# Patient Record
Sex: Male | Born: 1960 | Race: White | Hispanic: No | Marital: Married | Smoking: Current every day smoker
Health system: Southern US, Community
[De-identification: ages and names within clinical notes are randomized; demographics above are authoritative.]

## PROBLEM LIST (undated history)

## (undated) DIAGNOSIS — K2270 Barrett's esophagus without dysplasia: Secondary | ICD-10-CM

## (undated) DIAGNOSIS — G935 Compression of brain: Secondary | ICD-10-CM

## (undated) DIAGNOSIS — I2510 Atherosclerotic heart disease of native coronary artery without angina pectoris: Secondary | ICD-10-CM

## (undated) DIAGNOSIS — E785 Hyperlipidemia, unspecified: Secondary | ICD-10-CM

## (undated) DIAGNOSIS — F319 Bipolar disorder, unspecified: Secondary | ICD-10-CM

## (undated) DIAGNOSIS — I219 Acute myocardial infarction, unspecified: Secondary | ICD-10-CM

## (undated) DIAGNOSIS — I440 Atrioventricular block, first degree: Secondary | ICD-10-CM

## (undated) DIAGNOSIS — J45909 Unspecified asthma, uncomplicated: Secondary | ICD-10-CM

## (undated) DIAGNOSIS — R42 Dizziness and giddiness: Secondary | ICD-10-CM

## (undated) DIAGNOSIS — G43909 Migraine, unspecified, not intractable, without status migrainosus: Secondary | ICD-10-CM

## (undated) DIAGNOSIS — F4310 Post-traumatic stress disorder, unspecified: Secondary | ICD-10-CM

## (undated) DIAGNOSIS — Z8679 Personal history of other diseases of the circulatory system: Secondary | ICD-10-CM

## (undated) DIAGNOSIS — K219 Gastro-esophageal reflux disease without esophagitis: Secondary | ICD-10-CM

## (undated) HISTORY — DX: Post-traumatic stress disorder, unspecified: F43.10

## (undated) HISTORY — DX: Atrioventricular block, first degree: I44.0

## (undated) HISTORY — PX: HEMORRHOID SURGERY: SHX153

## (undated) HISTORY — DX: Acute myocardial infarction, unspecified: I21.9

## (undated) HISTORY — DX: Personal history of other diseases of the circulatory system: Z86.79

## (undated) HISTORY — DX: Bipolar disorder, unspecified: F31.9

## (undated) HISTORY — DX: Barrett's esophagus without dysplasia: K22.70

## (undated) HISTORY — DX: Hyperlipidemia, unspecified: E78.5

## (undated) HISTORY — DX: Dizziness and giddiness: R42

## (undated) HISTORY — DX: Gastro-esophageal reflux disease without esophagitis: K21.9

## (undated) HISTORY — DX: Migraine, unspecified, not intractable, without status migrainosus: G43.909

## (undated) HISTORY — PX: EYE SURGERY: SHX253

## (undated) HISTORY — DX: Unspecified asthma, uncomplicated: J45.909

## (undated) HISTORY — DX: Atherosclerotic heart disease of native coronary artery without angina pectoris: I25.10

## (undated) HISTORY — DX: Compression of brain: G93.5

---

## 2004-08-25 ENCOUNTER — Ambulatory Visit: Payer: Self-pay | Admitting: Oncology

## 2004-12-22 ENCOUNTER — Ambulatory Visit: Payer: Self-pay | Admitting: Oncology

## 2015-01-09 DIAGNOSIS — M5126 Other intervertebral disc displacement, lumbar region: Secondary | ICD-10-CM | POA: Diagnosis not present

## 2015-01-16 DIAGNOSIS — M549 Dorsalgia, unspecified: Secondary | ICD-10-CM | POA: Diagnosis not present

## 2015-01-16 DIAGNOSIS — G8929 Other chronic pain: Secondary | ICD-10-CM | POA: Diagnosis not present

## 2015-01-16 DIAGNOSIS — M5136 Other intervertebral disc degeneration, lumbar region: Secondary | ICD-10-CM | POA: Diagnosis not present

## 2015-01-16 DIAGNOSIS — M47896 Other spondylosis, lumbar region: Secondary | ICD-10-CM | POA: Diagnosis not present

## 2015-01-17 DIAGNOSIS — K589 Irritable bowel syndrome without diarrhea: Secondary | ICD-10-CM | POA: Diagnosis not present

## 2015-01-17 DIAGNOSIS — K219 Gastro-esophageal reflux disease without esophagitis: Secondary | ICD-10-CM | POA: Diagnosis not present

## 2015-01-30 DIAGNOSIS — R635 Abnormal weight gain: Secondary | ICD-10-CM | POA: Diagnosis not present

## 2015-01-30 DIAGNOSIS — Z6830 Body mass index (BMI) 30.0-30.9, adult: Secondary | ICD-10-CM | POA: Diagnosis not present

## 2015-02-13 DIAGNOSIS — B079 Viral wart, unspecified: Secondary | ICD-10-CM | POA: Diagnosis not present

## 2015-02-13 DIAGNOSIS — L299 Pruritus, unspecified: Secondary | ICD-10-CM | POA: Diagnosis not present

## 2015-02-19 DIAGNOSIS — K219 Gastro-esophageal reflux disease without esophagitis: Secondary | ICD-10-CM | POA: Diagnosis not present

## 2015-02-19 DIAGNOSIS — K589 Irritable bowel syndrome without diarrhea: Secondary | ICD-10-CM | POA: Diagnosis not present

## 2015-02-21 DIAGNOSIS — I2510 Atherosclerotic heart disease of native coronary artery without angina pectoris: Secondary | ICD-10-CM | POA: Insufficient documentation

## 2015-02-21 DIAGNOSIS — E785 Hyperlipidemia, unspecified: Secondary | ICD-10-CM | POA: Insufficient documentation

## 2015-02-21 DIAGNOSIS — F17200 Nicotine dependence, unspecified, uncomplicated: Secondary | ICD-10-CM | POA: Insufficient documentation

## 2015-02-21 DIAGNOSIS — IMO0001 Reserved for inherently not codable concepts without codable children: Secondary | ICD-10-CM

## 2015-02-21 HISTORY — DX: Nicotine dependence, unspecified, uncomplicated: F17.200

## 2015-02-21 HISTORY — DX: Atherosclerotic heart disease of native coronary artery without angina pectoris: I25.10

## 2015-02-21 HISTORY — DX: Reserved for inherently not codable concepts without codable children: IMO0001

## 2015-02-25 DIAGNOSIS — E663 Overweight: Secondary | ICD-10-CM | POA: Diagnosis not present

## 2015-02-25 DIAGNOSIS — Z6829 Body mass index (BMI) 29.0-29.9, adult: Secondary | ICD-10-CM | POA: Diagnosis not present

## 2015-03-22 DIAGNOSIS — L820 Inflamed seborrheic keratosis: Secondary | ICD-10-CM | POA: Diagnosis not present

## 2015-03-27 DIAGNOSIS — K2270 Barrett's esophagus without dysplasia: Secondary | ICD-10-CM | POA: Diagnosis not present

## 2015-03-28 DIAGNOSIS — E782 Mixed hyperlipidemia: Secondary | ICD-10-CM | POA: Diagnosis not present

## 2015-03-28 DIAGNOSIS — F17200 Nicotine dependence, unspecified, uncomplicated: Secondary | ICD-10-CM | POA: Diagnosis not present

## 2015-03-28 DIAGNOSIS — J302 Other seasonal allergic rhinitis: Secondary | ICD-10-CM | POA: Diagnosis not present

## 2015-03-28 DIAGNOSIS — Z125 Encounter for screening for malignant neoplasm of prostate: Secondary | ICD-10-CM | POA: Diagnosis not present

## 2015-03-28 DIAGNOSIS — R05 Cough: Secondary | ICD-10-CM | POA: Diagnosis not present

## 2015-04-17 DIAGNOSIS — R05 Cough: Secondary | ICD-10-CM | POA: Diagnosis not present

## 2015-05-02 DIAGNOSIS — Z23 Encounter for immunization: Secondary | ICD-10-CM | POA: Diagnosis not present

## 2015-05-16 DIAGNOSIS — M549 Dorsalgia, unspecified: Secondary | ICD-10-CM | POA: Diagnosis not present

## 2015-05-16 DIAGNOSIS — Z1389 Encounter for screening for other disorder: Secondary | ICD-10-CM | POA: Diagnosis not present

## 2015-05-16 DIAGNOSIS — G8929 Other chronic pain: Secondary | ICD-10-CM | POA: Diagnosis not present

## 2015-05-16 DIAGNOSIS — F17200 Nicotine dependence, unspecified, uncomplicated: Secondary | ICD-10-CM | POA: Diagnosis not present

## 2015-05-16 DIAGNOSIS — J40 Bronchitis, not specified as acute or chronic: Secondary | ICD-10-CM | POA: Diagnosis not present

## 2015-05-28 DIAGNOSIS — R05 Cough: Secondary | ICD-10-CM | POA: Diagnosis not present

## 2015-05-28 DIAGNOSIS — Z6828 Body mass index (BMI) 28.0-28.9, adult: Secondary | ICD-10-CM | POA: Diagnosis not present

## 2015-05-28 DIAGNOSIS — F17200 Nicotine dependence, unspecified, uncomplicated: Secondary | ICD-10-CM | POA: Diagnosis not present

## 2015-05-28 DIAGNOSIS — J302 Other seasonal allergic rhinitis: Secondary | ICD-10-CM | POA: Diagnosis not present

## 2015-06-12 DIAGNOSIS — J329 Chronic sinusitis, unspecified: Secondary | ICD-10-CM | POA: Diagnosis not present

## 2015-06-12 DIAGNOSIS — Z6827 Body mass index (BMI) 27.0-27.9, adult: Secondary | ICD-10-CM | POA: Diagnosis not present

## 2015-06-12 DIAGNOSIS — R05 Cough: Secondary | ICD-10-CM | POA: Diagnosis not present

## 2015-08-16 ENCOUNTER — Institutional Professional Consult (permissible substitution): Payer: Self-pay | Admitting: Internal Medicine

## 2016-09-29 ENCOUNTER — Encounter: Payer: Self-pay | Admitting: Allergy

## 2016-09-29 ENCOUNTER — Ambulatory Visit (INDEPENDENT_AMBULATORY_CARE_PROVIDER_SITE_OTHER): Payer: Medicare Other | Admitting: Allergy

## 2016-09-29 VITALS — BP 122/84 | HR 88 | Temp 97.5°F | Resp 16 | Ht 71.3 in | Wt 207.8 lb

## 2016-09-29 DIAGNOSIS — J3089 Other allergic rhinitis: Secondary | ICD-10-CM

## 2016-09-29 DIAGNOSIS — Z91018 Allergy to other foods: Secondary | ICD-10-CM | POA: Diagnosis not present

## 2016-09-29 DIAGNOSIS — J4540 Moderate persistent asthma, uncomplicated: Secondary | ICD-10-CM | POA: Diagnosis not present

## 2016-09-29 NOTE — Progress Notes (Signed)
New Patient Note  RE: Reginald Nguyen MRN: [id number] DOB: 04-Nov-1960 Date of Office Visit: 09/29/2016  Referring provider: Yisroel Ramming, MD Primary care provider: Yisroel Ramming, MD  Chief Complaint: Allergies  History of present illness: Reginald Nguyen is a 56 y.o. male presenting today for consultation for allergies.  He goes by Reginald Nguyen.   He has issues from winter thru summer with stuffy and itchy nose, watery eyes (mostly at night), sneezing as well as headaches.  Last year he reports he got vertigo along with his allergy symptoms.  He takes singulair, Xyzal and flonase.  He does not feel the singulair is helpful.  He has seen as many years ago and recalls being allergic to ragweed but does not remember any other allergens.  He has a history of asthma.  He is followed at [facility] clinic.  He sees Dr. Blenda Nicely and has an appointment in April.  He takes Breo and albuterol.    He reports having eczema and psoriaris and believes he uses a steroid cream.  He sees Dr. Mayford Knife in dermatology.    He has a history of food allergy.  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  He has avoided ever since.    Review of systems: Review of Systems  Constitutional: Negative for chills, fever and malaise/fatigue.  HENT: Positive for congestion. Negative for ear discharge, ear pain, nosebleeds, sinus pain, sore throat and tinnitus.   Eyes: Negative for discharge and redness.  Respiratory: Positive for cough. Negative for shortness of breath and wheezing.   Cardiovascular: Negative for chest pain.  Gastrointestinal: Negative for abdominal pain, diarrhea, nausea and vomiting.  Musculoskeletal: Negative for joint pain and myalgias.  Skin: Positive for itching and rash.  Neurological: Positive for dizziness. Negative for headaches.    All other systems negative unless  noted above in HPI  Past medical history: Past Medical History:  Diagnosis Date  . Asthma   . Bipolar disorder (HCC)   . CAD (coronary artery disease)   . Dyslipidemia   . Vertigo     Past surgical history: Past Surgical History:  Procedure Laterality Date  . EYE SURGERY Bilateral   . HEMORRHOID SURGERY      Family history:  Family History  Problem Relation Age of Onset  . Hypertension Father   . Cancer Maternal Grandmother   . Cancer Maternal Grandfather   . Cancer Paternal Grandmother   . Cancer Paternal Grandfather     Social history: He lives in an apartment with carpeting with electric heating and window cooling. There is a dog in the home which does stay in the bedroom. There is no concern for water damage, mildew in the home. There is concern for roaches in the home. He is disabled and not employed since 1996.  He does have a smoking history of 1.5 pack per day.   Medication List: Allergies as of 09/29/2016      Reactions   Codeine Other (See Comments)   ask   Morphine Other (See Comments)   ask   Sulfa Antibiotics       Medication List       Accurate as of 09/29/16 12:27 PM. Always use your most recent med list.          ASPIRIN 81 PO Take 162 mg by mouth daily.  atorvastatin 10 MG tablet Commonly known as:  LIPITOR   BREO ELLIPTA 200-25 MCG/INH Aepb Generic drug:  fluticasone furoate-vilanterol   busPIRone 15 MG tablet Commonly known as:  BUSPAR   clonazePAM 2 MG tablet Commonly known as:  KLONOPIN   dicyclomine 20 MG tablet Commonly known as:  BENTYL Take 20 mg by mouth.   doxepin 50 MG capsule Commonly known as:  SINEQUAN   fenofibrate micronized 134 MG capsule Commonly known as:  LOFIBRA   FLUoxetine 20 MG capsule Commonly known as:  PROZAC   fluticasone 50 MCG/ACT nasal spray Commonly known as:  FLONASE   ipratropium 0.02 % nebulizer solution Commonly known as:  ATROVENT   lamoTRIgine 200 MG tablet Commonly known as:   LAMICTAL   lamoTRIgine 25 MG tablet Commonly known as:  LAMICTAL   levocetirizine 5 MG tablet Commonly known as:  XYZAL   meclizine 25 MG tablet Commonly known as:  ANTIVERT Take 25 mg by mouth 2 (two) times daily.   montelukast 10 MG tablet Commonly known as:  SINGULAIR   nitroGLYCERIN 0.4 MG SL tablet Commonly known as:  NITROSTAT Place 0.4 mg under the tongue.   pantoprazole 40 MG tablet Commonly known as:  PROTONIX   polyethylene glycol powder powder Commonly known as:  GLYCOLAX/MIRALAX   PROAIR HFA 108 (90 Base) MCG/ACT inhaler Generic drug:  albuterol       Known medication allergies: Allergies  Allergen Reactions  . Codeine Other (See Comments)    ask  . Morphine Other (See Comments)    ask  . Sulfa Antibiotics      Physical examination: Blood pressure 122/84, pulse 88, temperature 97.5 F (36.4 C), temperature source Oral, resp. rate 16, height 5' 11.3" (1.811 m), weight 207 lb 12.8 oz (94.3 kg).  General: Alert, interactive, in no acute distress. HEENT: TMs pearly gray, turbinates mildly edematous without discharge, post-pharynx non erythematous. Neck: Supple without lymphadenopathy. Lungs: Clear to auscultation without wheezing, rhonchi or rales. {no increased work of breathing. CV: Normal S1, S2 without murmurs. Abdomen: Nondistended, nontender. Skin: Warm and dry, without lesions or rashes. Extremities:  No clubbing, cyanosis or edema. Neuro:   Grossly intact.  Diagnositics/Labs:  Spirometry: FEV1: 2.79L  73%, FVC: 3.63L  72%, ratio consistent with Restrictive pattern  Allergy testing: Deferred due to recent antihistamine use Allergy testing results were read and interpreted by provider, documented by clinical staff.   Assessment and plan:   Allergic rhinoconjunctivitis     - will obtain environmental allergen profile     - at this time continue Xyzal 5mg  daily, Singulair 10mg  (for your asthma history) and Flonase 2 sprays each nostril  daily      - use nasal saline spray to help keep nose moisturized      Asthma, moderate persistent     - continue your Breo, singulair and as needed albuterol     - keep follow-up with your pulmonologist, Dr. Blenda Nicely  Food allergy     - continue avoidance of mushroom at this time     - will obtain mushroom IgE  Follow-up 4-6 months  I appreciate the opportunity to take part in Hu[place]'s care. Please do not hesitate to contact me with questions.  Sincerely,   Margo Aye, MD Allergy/Immunology [facility]

## 2016-09-29 NOTE — Patient Instructions (Addendum)
Allergies     - will obtain environmental allergen profile     - at this time continue Xyzal 5mg  daily, Singulair 10mg  (for your asthma history) and Flonase 2 sprays each nostril daily      - use nasal saline spray to help keep nose moisturized      Asthma     - continue your Breo, singulair and as needed albuterol     - keep follow-up with your pulmonologist, Dr. Blenda Nicely  Food allergy     - continue avoidance of mushroom at this time     - will obtain mushroom IgE  Follow-up 4-6 months

## 2016-10-18 LAB — ALLERGENS W/TOTAL IGE AREA 2
Aspergillus Fumigatus IgE: <0.1 kU/L
Bermuda Grass IgE: <0.1 kU/L
Cedar, Mountain IgE: <0.1 kU/L
Cockroach, German IgE: <0.1 kU/L
Cottonwood IgE: <0.1 kU/L
D Farinae IgE: <0.1 kU/L
D Pteronyssinus IgE: <0.1 kU/L
Dog Dander IgE: <0.1 kU/L
IgE (Immunoglobulin E), Serum: <2 IU/mL (ref 0–100)
Pecan, Hickory IgE: <0.1 kU/L
Penicillium Chrysogen IgE: <0.1 kU/L
Pigweed, Rough IgE: <0.1 kU/L
Ragweed, Short IgE: <0.1 kU/L
Sheep Sorrel IgE Qn: <0.1 kU/L
Timothy Grass IgE: <0.1 kU/L

## 2016-10-18 LAB — F212-IGE MUSHROOM: Mushroom IgE: <0.1 kU/L

## 2017-01-14 ENCOUNTER — Telehealth: Payer: Self-pay | Admitting: Allergy and Immunology

## 2017-01-14 NOTE — Telephone Encounter (Signed)
Advised pt that [facility] Medicare says this balance is his copay - he says he has never had to pay any of his other doctors - told him to call his insurance - kt

## 2017-01-14 NOTE — Telephone Encounter (Signed)
Reginald Nguyen called in upset about his 6.86 balance.  He stated with his insurance he feels he isn't paying this.  He stated he has never paid for a Doctor visit and he is not going to start now.  Please advise.

## 2017-03-16 DIAGNOSIS — G2581 Restless legs syndrome: Secondary | ICD-10-CM | POA: Insufficient documentation

## 2017-03-16 HISTORY — DX: Restless legs syndrome: G25.81

## 2017-07-14 ENCOUNTER — Other Ambulatory Visit: Payer: Self-pay | Admitting: Cardiology

## 2017-08-25 DIAGNOSIS — R5381 Other malaise: Secondary | ICD-10-CM | POA: Insufficient documentation

## 2017-08-25 DIAGNOSIS — R5383 Other fatigue: Secondary | ICD-10-CM

## 2017-08-25 HISTORY — DX: Other malaise: R53.81

## 2017-08-25 HISTORY — DX: Other fatigue: R53.83

## 2018-03-30 DIAGNOSIS — R0789 Other chest pain: Secondary | ICD-10-CM

## 2018-03-30 HISTORY — DX: Other chest pain: R07.89

## 2018-10-24 ENCOUNTER — Telehealth: Payer: Self-pay | Admitting: Neurology

## 2018-10-24 NOTE — Telephone Encounter (Signed)
I called and spoke with the patient regarding changing their apt to a VV due to the COVID-19.  I explained to the patient that we would file their insurance and they gave consent. I also walked through the steps of downloading the app, and starting the virtual meeting, after confirming the patient had access to a smart phone with a working camera and microphone access. I confirmed his email as [email or web address]   Meeting number: [phone] Password: [email or web address] Host key: [id number]

## 2018-10-25 ENCOUNTER — Encounter: Payer: Self-pay | Admitting: Neurology

## 2018-10-25 ENCOUNTER — Encounter: Payer: Self-pay | Admitting: *Deleted

## 2018-10-25 NOTE — Telephone Encounter (Signed)
Spoke with pt. Confirmed pt using 2 identifiers. He provided updates to his PMH, meds, etc. He stated he had not received the email with link to join yet so I have reached out to [facility] to see if this can be resent. He confirmed his email is [email or web address]. He stated he had not downloaded the webex app so I encouraged pt to go ahead and do that today and be on the lookout for an email. Reviewed the application is [employer]. Advised pt if he is not able to have this setup in time for the appt tomorrow at 1:30 we will need to r/s. He verbalized understanding and appreciation.

## 2018-10-25 NOTE — Telephone Encounter (Signed)
Noted thanks °

## 2018-10-25 NOTE — Telephone Encounter (Signed)
I called the patient and LVM asking for call back stated I needed to speak with him briefly before his virtual appt with Dr. Lucia Gaskins.   Need to update his chart (PMH, meds, etc) when he calls back.

## 2018-10-25 NOTE — Addendum Note (Signed)
Addended by: Bertram Savin on: 10/25/2018 01:39 PM   Modules accepted: Orders

## 2018-10-25 NOTE — Telephone Encounter (Signed)
I called the patient twice to confirm email but he did not answer. I resent webex email instructions to the email provided [email or web address]

## 2018-10-26 ENCOUNTER — Other Ambulatory Visit: Payer: Self-pay

## 2018-10-26 ENCOUNTER — Ambulatory Visit (INDEPENDENT_AMBULATORY_CARE_PROVIDER_SITE_OTHER): Payer: Medicare HMO | Admitting: Neurology

## 2018-10-26 DIAGNOSIS — Z5329 Procedure and treatment not carried out because of patient's decision for other reasons: Secondary | ICD-10-CM

## 2018-10-26 DIAGNOSIS — Z91199 Patient's noncompliance with other medical treatment and regimen due to unspecified reason: Secondary | ICD-10-CM

## 2018-10-26 NOTE — Progress Notes (Signed)
   Complete physical exam  Patient: Reginald Nguyen   DOB: 04/25/2000   58 y.o. Male  MRN: [id number]  Subjective:    No chief complaint on file.   Reginald Nguyen is a 58 y.o. male who presents today for a complete physical exam. She reports consuming a {diet types:17450} diet. {types:19826} She generally feels {DESC; WELL/FAIRLY WELL/POORLY:18703}. She reports sleeping {DESC; WELL/FAIRLY WELL/POORLY:18703}. She {does/does not:200015} have additional problems to discuss today.    Most recent fall risk assessment:    12/31/2021   10:42 AM  Fall Risk   Falls in the past year? 0  Number falls in past yr: 0  Injury with Fall? 0  Risk for fall due to : No Fall Risks  Follow up Falls evaluation completed     Most recent depression screenings:    12/31/2021   10:42 AM 11/21/2020   10:46 AM  PHQ 2/9 Scores  PHQ - 2 Score 0 0  PHQ- 9 Score 5     {VISON DENTAL STD PSA (Optional):27386}  {History (Optional):23778}  Patient Care Team: Jessup, Joy, NP as PCP - General (Nurse Practitioner)   Outpatient Medications Prior to Visit  Medication Sig   fluticasone (FLONASE) 50 MCG/ACT nasal spray Place 2 sprays into both nostrils in the morning and at bedtime. After 7 days, reduce to once daily.   norgestimate-ethinyl estradiol (SPRINTEC 28) 0.25-35 MG-MCG tablet Take 1 tablet by mouth daily.   Nystatin POWD Apply liberally to affected area 2 times per day   spironolactone (ALDACTONE) 100 MG tablet Take 1 tablet (100 mg total) by mouth daily.   No facility-administered medications prior to visit.    ROS        Objective:     There were no vitals taken for this visit. {Vitals History (Optional):23777}  Physical Exam   No results found for any visits on 02/05/22. {Show previous labs (optional):23779}    Assessment & Plan:    Routine Health Maintenance and Physical Exam  Immunization History  Administered Date(s) Administered   DTaP 07/09/1999, 09/04/1999,  11/13/1999, 07/29/2000, 02/12/2004   Hepatitis A 12/09/2007, 12/14/2008   Hepatitis B 04/26/1999, 06/03/1999, 11/13/1999   HiB (PRP-OMP) 07/09/1999, 09/04/1999, 11/13/1999, 07/29/2000   IPV 07/09/1999, 09/04/1999, 05/03/2000, 02/12/2004   Influenza,inj,Quad PF,6+ Mos 03/16/2014   Influenza-Unspecified 06/15/2012   MMR 05/03/2001, 02/12/2004   Meningococcal Polysaccharide 12/14/2011   Pneumococcal Conjugate-13 07/29/2000   Pneumococcal-Unspecified 11/13/1999, 01/27/2000   Tdap 12/14/2011   Varicella 05/03/2000, 12/09/2007    Health Maintenance  Topic Date Due   HIV Screening  Never done   Hepatitis C Screening  Never done   INFLUENZA VACCINE  02/03/2022   PAP-Cervical Cytology Screening  02/05/2022 (Originally 04/24/2020)   PAP SMEAR-Modifier  02/05/2022 (Originally 04/24/2020)   TETANUS/TDAP  02/05/2022 (Originally 12/13/2021)   HPV VACCINES  Discontinued   COVID-19 Vaccine  Discontinued    Discussed health benefits of physical activity, and encouraged her to engage in regular exercise appropriate for her age and condition.  Problem List Items Addressed This Visit   None Visit Diagnoses     Annual physical exam    -  Primary   Cervical cancer screening       Need for Tdap vaccination          No follow-ups on file.     Joy Jessup, NP   

## 2018-11-16 DIAGNOSIS — E782 Mixed hyperlipidemia: Secondary | ICD-10-CM

## 2018-11-16 HISTORY — DX: Mixed hyperlipidemia: E78.2

## 2019-03-21 ENCOUNTER — Ambulatory Visit: Payer: Medicare HMO | Admitting: Allergy

## 2019-03-28 ENCOUNTER — Ambulatory Visit: Payer: Medicare Other | Admitting: Allergy

## 2019-04-05 DIAGNOSIS — N529 Male erectile dysfunction, unspecified: Secondary | ICD-10-CM

## 2019-04-05 HISTORY — DX: Male erectile dysfunction, unspecified: N52.9

## 2019-04-11 ENCOUNTER — Encounter: Payer: Self-pay | Admitting: Allergy

## 2019-04-11 ENCOUNTER — Other Ambulatory Visit: Payer: Self-pay

## 2019-04-11 ENCOUNTER — Ambulatory Visit (INDEPENDENT_AMBULATORY_CARE_PROVIDER_SITE_OTHER): Payer: Medicare Other | Admitting: Allergy

## 2019-04-11 VITALS — BP 114/74 | HR 88 | Temp 98.4°F | Resp 18 | Ht 70.7 in | Wt 238.2 lb

## 2019-04-11 DIAGNOSIS — Z91018 Allergy to other foods: Secondary | ICD-10-CM | POA: Diagnosis not present

## 2019-04-11 DIAGNOSIS — J4540 Moderate persistent asthma, uncomplicated: Secondary | ICD-10-CM | POA: Diagnosis not present

## 2019-04-11 DIAGNOSIS — J310 Chronic rhinitis: Secondary | ICD-10-CM

## 2019-04-11 MED ORDER — AZELASTINE HCL 0.1 % NA SOLN: NASAL | 5 refills | Status: DC

## 2019-04-11 NOTE — Patient Instructions (Addendum)
Allergies     - worsening nasal drainage     - environmental allergy panel via blood work was negative in 2018.   Recommend a skin testing visit off all antihistamines to see if you are allergic to any environmental allergens     - will change Xyzal to Allegra 180mg  daily     - continue Singulair 10mg  daily      - stop the nasal spray you are currently using daily     - start nasal Astelin 2 sprays each nostril 1-2 times a day for nasal drainage control.  This spray does not taste good if gets in back of throat thus would recommend rinses out mouth after use.       - use nasal saline spray to help keep nose moisturized      Asthma     - continue your Breo 1 puff daily and singulair as above     - have access to albuterol inhaler 2 puffs every 4-6 hours as needed for cough/wheeze/shortness of breath/chest tightness.  May use 15-20 minutes prior to activity.   Monitor frequency of use.       - lung function testing looks good today  Food allergy     - continue avoidance of mushroom at this time     - mushroom IgE was negative.    If interested in eating mushrooms in future recommend skin testing otherwise can continue avoidance.     Follow-up 4-6 months

## 2019-04-11 NOTE — Progress Notes (Signed)
Follow-up Note  RE: Reginald Nguyen MRN: [id number] DOB: June 10, 1961 Date of Office Visit: 04/11/2019   History of present illness: Reginald Nguyen is a 57 y.o. male presenting today for follow-up of allergic rhinitis with conjunctivitis, asthma and food allergy.  He was last seen in the office on September 29, 2016 by myself.  He states since this visit he has not had any major health changes, surgeries or hospitalizations. He states he has been having a lot of issues with his allergies.  He states he is waking up with a lot of nasal drainage.  He states the drainage is so severe that he normally needs to spit it out and sometimes he will vomit as it upsets his stomach.  He states the symptoms were worse this summer.  He has been taking levocetirizine for the past 2 years daily.  He continues on montelukast daily.  He states he has been using a nasal spray in the mornings every day but he is not sure what this spray is.  He states it is not a prescription based spray.  He does state however that he does not like to use Flonase and the spray he is currently using is something different.  He also states that he cannot perform nasal saline rinses as he does not tolerate if any water gets in his throat or mouth. At his last visit Unable to skin test due to antihistamine use and obtained an environmental panel via serum IgE which was negative.  With his asthma he states he has been doing well without any flareups and has not needed to have any systemic steroids, ED or urgent care visits.  He denies any nighttime awakenings.  He states he has been using Breo 1 puff daily as well as albuterol twice a day.  On questioning he states that his PCP recommended he use albuterol twice a day.  He continues to avoid mushrooms and has no interest in ever eating them.  Review of systems: Review of Systems  Constitutional: Negative for chills, fever and malaise/fatigue.  HENT: Negative for congestion, ear discharge,  nosebleeds, sinus pain and sore throat.   Eyes: Negative for pain, discharge and redness.  Respiratory: Negative for cough, shortness of breath and wheezing.   Cardiovascular: Negative for chest pain.  Gastrointestinal: Positive for abdominal pain and vomiting. Negative for constipation, diarrhea, heartburn and nausea.  Musculoskeletal: Negative for joint pain.  Skin: Negative for itching and rash.  Neurological: Negative for headaches.    All other systems negative unless noted above in HPI  Past medical/social/surgical/family history have been reviewed and are unchanged unless specifically indicated below.  No changes  Medication List: Current Outpatient Medications  Medication Sig Dispense Refill  . albuterol (VENTOLIN HFA) 108 (90 Base) MCG/ACT inhaler Take 2 puffs by mouth every 4-6 hours as needed for coughing or shortness of breath.    . ASPIRIN 81 PO Take 1 tablet by mouth daily.    Marland Kitchen atorvastatin (LIPITOR) 10 MG tablet Take 10 mg by mouth daily.    Marland Kitchen BREO ELLIPTA 100-25 MCG/INH AEPB Take 1 inhalation once a day    . busPIRone (BUSPAR) 15 MG tablet TAKE ONE TABLET BY MOUTH EVERY DAY and TAKE ONE TABLET BY MOUTH DAIILY AT 3 PM and TAKE ONE TABLET AT BEDTIME    . clonazePAM (KLONOPIN) 2 MG tablet Take one (1) tablet by mouth once a day, as needed    . doxepin (SINEQUAN) 50 MG capsule Take  100 mg by mouth at bedtime.     . fenofibrate (TRICOR) 145 MG tablet Take 145 mg by mouth daily.    Marland Kitchen FLUoxetine (PROZAC) 20 MG capsule Take 20 mg by mouth daily.    Marland Kitchen KRILL OIL PO Take by mouth.    . lamoTRIgine (LAMICTAL) 200 MG tablet Take one (1) tablet by mouth every morning    . lamoTRIgine (LAMICTAL) 25 MG tablet Take 25 mg by mouth daily.    Marland Kitchen levocetirizine (XYZAL) 5 MG tablet Take 5 mg by mouth daily.    . meloxicam (MOBIC) 7.5 MG tablet Take 7.5 mg by mouth 2 (two) times daily as needed for pain.    . montelukast (SINGULAIR) 10 MG tablet Take 10 mg by mouth at bedtime.    .  nitroGLYCERIN (NITROSTAT) 0.4 MG SL tablet Place 0.4 mg under the tongue.    . pantoprazole (PROTONIX) 40 MG tablet Take 40 mg by mouth 2 (two) times daily.     . Vitamin D, Ergocalciferol, (DRISDOL) 1.25 MG (50000 UT) CAPS capsule Take 50,000 Units by mouth once a week.    Marland Kitchen VITAMIN E PO Take by mouth.    . triamcinolone cream (KENALOG) 0.1 % APPLY TWICE DAILY AS NEEDED TO INVOLVED AREAS FOR 1-2 WEEKS.     No current facility-administered medications for this visit.      Known medication allergies: Allergies  Allergen Reactions  . Codeine Other (See Comments)    ask  . Morphine Other (See Comments)    ask  . Other     Lemons, Limes, Mushrooms  Has to have allergy shots  . Sulfa Antibiotics      Physical examination: Blood pressure 114/74, pulse 88, temperature 98.4 F (36.9 C), temperature source Temporal, resp. rate 18, height 5' 10.7" (1.796 m), weight 238 lb 3.2 oz (108 kg), SpO2 97 %.  General: Alert, interactive, in no acute distress. HEENT: PERRLA, TMs pearly gray, turbinates mildly edematous with copious clear discharge, post-pharynx unremarkable, edentulous Neck: Supple without lymphadenopathy. Lungs: Clear to auscultation without wheezing, rhonchi or rales. {no increased work of breathing. CV: Normal S1, S2 without murmurs. Abdomen: Nondistended, nontender. Skin: Warm and dry, without lesions or rashes. Extremities:  No clubbing, cyanosis or edema. Neuro:   Grossly intact.  Diagnositics/Labs: Labs:  Component     Latest Ref Rng & Units 10/14/2016  IgE (Immunoglobulin E), Serum     0 - 100 IU/mL <2  D Pteronyssinus IgE     Class 0 kU/L <0.10  D Farinae IgE     Class 0 kU/L <0.10  Cat Dander IgE     Class 0 kU/L <0.10  Dog Dander IgE     Class 0 kU/L <0.10  [place] Grass IgE     Class 0 kU/L <0.10  Timothy Grass IgE     Class 0 kU/L <0.10  [place] Grass IgE     Class 0 kU/L <0.10  Cockroach, German IgE     Class 0 kU/L <0.10  Penicillium Chrysogen IgE      Class 0 kU/L <0.10  Cladosporium Herbarum IgE     Class 0 kU/L <0.10  Aspergillus Fumigatus IgE     Class 0 kU/L <0.10  Alternaria Alternata IgE     Class 0 kU/L <0.10  Maple/Box Elder IgE     Class 0 kU/L <0.10  Common Silver Charletta Cousin IgE     Class 0 kU/L <0.10  [place] IgE     Class 0  kU/L <0.10  Oak, White IgE     Class 0 kU/L <0.10  Elm, American IgE     Class 0 kU/L <0.10  [place] IgE     Class 0 kU/L <0.10  Pecan, Hickory IgE     Class 0 kU/L <0.10  White Mulberry IgE     Class 0 kU/L <0.10  Ragweed, Short IgE     Class 0 kU/L <0.10  Pigweed, Rough IgE     Class 0 kU/L <0.10  Sheep Sorrel IgE Qn     Class 0 kU/L <0.10  Mouse Urine IgE     Class 0 kU/L <0.10  Mushroom IgE     Class 0 kU/L <0.10    Spirometry: FEV1: 3.21L 86%, FVC: 4.26L 86%, ratio consistent with Nonobstructive pattern  Assessment and plan: Rhinitis with significant postnasal drip     - worsening nasal drainage     - environmental allergy panel via blood work was negative in 2018.   Recommend a skin testing visit off all antihistamines to see if you are allergic to any environmental allergens     - will change Xyzal to Allegra 180mg  daily     - continue Singulair 10mg  daily      - stop the nasal spray you are currently using daily     - start nasal Astelin 2 sprays each nostril 1-2 times a day for nasal drainage control.  This spray does not taste good if gets in back of throat thus would recommend rinses out mouth after use.       - use nasal saline spray to help keep nose moisturized      Asthma, moderate persistent     - continue your Breo 1 puff daily and singulair as above     - have access to albuterol inhaler 2 puffs every 4-6 hours as needed for cough/wheeze/shortness of breath/chest tightness.  May use 15-20 minutes prior to activity.   Monitor frequency of use.       - lung function testing looks good today  Food allergy     - continue avoidance of mushroom at this time      - mushroom IgE was negative.    If interested in eating mushrooms in future recommend skin testing otherwise can continue avoidance.     Follow-up 4-6 months  I appreciate the opportunity to take part in Reginald Nguyen's care. Please do not hesitate to contact me with questions.  Sincerely,   Prudy Feeler, MD Allergy/Immunology [facility]

## 2019-07-19 DIAGNOSIS — N182 Chronic kidney disease, stage 2 (mild): Secondary | ICD-10-CM

## 2019-07-19 HISTORY — DX: Chronic kidney disease, stage 2 (mild): N18.2

## 2019-08-29 ENCOUNTER — Inpatient Hospital Stay (HOSPITAL_COMMUNITY): Payer: Medicare Other

## 2019-08-29 ENCOUNTER — Encounter (HOSPITAL_COMMUNITY): Payer: Self-pay | Admitting: Internal Medicine

## 2019-08-29 ENCOUNTER — Other Ambulatory Visit: Payer: Self-pay | Admitting: Cardiology

## 2019-08-29 ENCOUNTER — Observation Stay (HOSPITAL_COMMUNITY)
Admission: AD | Admit: 2019-08-29 | Discharge: 2019-08-30 | Disposition: A | Discharge status: Home / Self Care | Payer: Medicare Other | Source: Other Acute Inpatient Hospital | Attending: Internal Medicine | Admitting: Internal Medicine

## 2019-08-29 DIAGNOSIS — F4310 Post-traumatic stress disorder, unspecified: Secondary | ICD-10-CM | POA: Diagnosis not present

## 2019-08-29 DIAGNOSIS — Z885 Allergy status to narcotic agent status: Secondary | ICD-10-CM | POA: Insufficient documentation

## 2019-08-29 DIAGNOSIS — I25110 Atherosclerotic heart disease of native coronary artery with unstable angina pectoris: Principal | ICD-10-CM | POA: Insufficient documentation

## 2019-08-29 DIAGNOSIS — Z888 Allergy status to other drugs, medicaments and biological substances status: Secondary | ICD-10-CM | POA: Insufficient documentation

## 2019-08-29 DIAGNOSIS — Z79899 Other long term (current) drug therapy: Secondary | ICD-10-CM | POA: Diagnosis not present

## 2019-08-29 DIAGNOSIS — G935 Compression of brain: Secondary | ICD-10-CM | POA: Insufficient documentation

## 2019-08-29 DIAGNOSIS — R079 Chest pain, unspecified: Secondary | ICD-10-CM

## 2019-08-29 DIAGNOSIS — G43909 Migraine, unspecified, not intractable, without status migrainosus: Secondary | ICD-10-CM | POA: Diagnosis not present

## 2019-08-29 DIAGNOSIS — Z7982 Long term (current) use of aspirin: Secondary | ICD-10-CM | POA: Diagnosis not present

## 2019-08-29 DIAGNOSIS — J439 Emphysema, unspecified: Secondary | ICD-10-CM | POA: Diagnosis not present

## 2019-08-29 DIAGNOSIS — F319 Bipolar disorder, unspecified: Secondary | ICD-10-CM

## 2019-08-29 DIAGNOSIS — K219 Gastro-esophageal reflux disease without esophagitis: Secondary | ICD-10-CM | POA: Insufficient documentation

## 2019-08-29 DIAGNOSIS — IMO0001 Reserved for inherently not codable concepts without codable children: Secondary | ICD-10-CM | POA: Diagnosis present

## 2019-08-29 DIAGNOSIS — F17200 Nicotine dependence, unspecified, uncomplicated: Secondary | ICD-10-CM | POA: Diagnosis present

## 2019-08-29 DIAGNOSIS — J449 Chronic obstructive pulmonary disease, unspecified: Secondary | ICD-10-CM | POA: Diagnosis present

## 2019-08-29 DIAGNOSIS — Z7951 Long term (current) use of inhaled steroids: Secondary | ICD-10-CM | POA: Insufficient documentation

## 2019-08-29 DIAGNOSIS — Z20822 Contact with and (suspected) exposure to covid-19: Secondary | ICD-10-CM | POA: Diagnosis not present

## 2019-08-29 DIAGNOSIS — R42 Dizziness and giddiness: Secondary | ICD-10-CM | POA: Insufficient documentation

## 2019-08-29 DIAGNOSIS — D649 Anemia, unspecified: Secondary | ICD-10-CM | POA: Insufficient documentation

## 2019-08-29 DIAGNOSIS — E785 Hyperlipidemia, unspecified: Secondary | ICD-10-CM | POA: Diagnosis not present

## 2019-08-29 DIAGNOSIS — Z791 Long term (current) use of non-steroidal anti-inflammatories (NSAID): Secondary | ICD-10-CM | POA: Insufficient documentation

## 2019-08-29 DIAGNOSIS — K2270 Barrett's esophagus without dysplasia: Secondary | ICD-10-CM | POA: Insufficient documentation

## 2019-08-29 DIAGNOSIS — I200 Unstable angina: Secondary | ICD-10-CM

## 2019-08-29 DIAGNOSIS — Z882 Allergy status to sulfonamides status: Secondary | ICD-10-CM | POA: Diagnosis not present

## 2019-08-29 DIAGNOSIS — I440 Atrioventricular block, first degree: Secondary | ICD-10-CM | POA: Insufficient documentation

## 2019-08-29 DIAGNOSIS — I252 Old myocardial infarction: Secondary | ICD-10-CM | POA: Insufficient documentation

## 2019-08-29 DIAGNOSIS — R0789 Other chest pain: Secondary | ICD-10-CM

## 2019-08-29 DIAGNOSIS — Z6832 Body mass index (BMI) 32.0-32.9, adult: Secondary | ICD-10-CM | POA: Diagnosis not present

## 2019-08-29 DIAGNOSIS — F17210 Nicotine dependence, cigarettes, uncomplicated: Secondary | ICD-10-CM | POA: Insufficient documentation

## 2019-08-29 DIAGNOSIS — I10 Essential (primary) hypertension: Secondary | ICD-10-CM | POA: Diagnosis not present

## 2019-08-29 DIAGNOSIS — E669 Obesity, unspecified: Secondary | ICD-10-CM | POA: Diagnosis not present

## 2019-08-29 DIAGNOSIS — Z955 Presence of coronary angioplasty implant and graft: Secondary | ICD-10-CM | POA: Insufficient documentation

## 2019-08-29 HISTORY — DX: Unstable angina: I20.0

## 2019-08-29 HISTORY — DX: Chronic obstructive pulmonary disease, unspecified: J44.9

## 2019-08-29 HISTORY — DX: Bipolar disorder, unspecified: F31.9

## 2019-08-29 LAB — CBC WITH DIFFERENTIAL/PLATELET
Abs Immature Granulocytes: 0.04 10*3/uL (ref 0.00–0.07)
Basophils Absolute: 0 10*3/uL (ref 0.0–0.1)
Basophils Relative: 0 %
Eosinophils Absolute: 0 10*3/uL (ref 0.0–0.5)
Eosinophils Relative: 0 %
HCT: 40.7 % (ref 39.0–52.0)
Hemoglobin: 13.8 g/dL (ref 13.0–17.0)
Immature Granulocytes: 1 %
Lymphocytes Relative: 14 %
Lymphs Abs: 0.7 10*3/uL (ref 0.7–4.0)
MCH: 31.3 pg (ref 26.0–34.0)
MCHC: 33.9 g/dL (ref 30.0–36.0)
MCV: 92.3 fL (ref 80.0–100.0)
Monocytes Absolute: 0.1 10*3/uL (ref 0.1–1.0)
Monocytes Relative: 2 %
Neutro Abs: 3.9 10*3/uL (ref 1.7–7.7)
Neutrophils Relative %: 83 %
Platelets: 245 10*3/uL (ref 150–400)
RBC: 4.41 MIL/uL (ref 4.22–5.81)
RDW: 13.2 % (ref 11.5–15.5)
WBC: 4.7 10*3/uL (ref 4.0–10.5)
nRBC: 0 % (ref 0.0–0.2)

## 2019-08-29 LAB — COMPREHENSIVE METABOLIC PANEL
ALT: 34 U/L (ref 0–44)
AST: 41 U/L (ref 15–41)
Albumin: 3.6 g/dL (ref 3.5–5.0)
Alkaline Phosphatase: 56 U/L (ref 38–126)
Anion gap: 11 (ref 5–15)
BUN: 15 mg/dL (ref 6–20)
CO2: 26 mmol/L (ref 22–32)
Calcium: 9.3 mg/dL (ref 8.9–10.3)
Chloride: 102 mmol/L (ref 98–111)
Creatinine, Ser: 1.38 mg/dL — ABNORMAL HIGH (ref 0.61–1.24)
GFR calc Af Amer: >60 mL/min (ref >60)
GFR calc non Af Amer: 56 mL/min — ABNORMAL LOW (ref >60)
Glucose, Bld: 174 mg/dL — ABNORMAL HIGH (ref 70–99)
Potassium: 4.2 mmol/L (ref 3.5–5.1)
Sodium: 139 mmol/L (ref 135–145)
Total Bilirubin: 0.8 mg/dL (ref 0.3–1.2)
Total Protein: 6.2 g/dL — ABNORMAL LOW (ref 6.5–8.1)

## 2019-08-29 LAB — HEMOGLOBIN A1C
Hgb A1c MFr Bld: 5.2 % (ref 4.8–5.6)
Mean Plasma Glucose: 102.54 mg/dL

## 2019-08-29 LAB — LIPID PANEL
Cholesterol: 161 mg/dL (ref 0–200)
HDL: 48 mg/dL (ref >40)
LDL Cholesterol: 96 mg/dL (ref 0–99)
Total CHOL/HDL Ratio: 3.4 RATIO
Triglycerides: 86 mg/dL (ref <150)
VLDL: 17 mg/dL (ref 0–40)

## 2019-08-29 LAB — TROPONIN I (HIGH SENSITIVITY)
Troponin I (High Sensitivity): 2 ng/L (ref <18)
Troponin I (High Sensitivity): <2 ng/L (ref <18)

## 2019-08-29 LAB — TSH: TSH: 0.726 u[IU]/mL (ref 0.350–4.500)

## 2019-08-29 LAB — HIV ANTIBODY (ROUTINE TESTING W REFLEX): HIV Screen 4th Generation wRfx: NONREACTIVE

## 2019-08-29 LAB — HEPARIN LEVEL (UNFRACTIONATED): Heparin Unfractionated: 0.17 IU/mL — ABNORMAL LOW (ref 0.30–0.70)

## 2019-08-29 LAB — ECHOCARDIOGRAM COMPLETE
Height: 71 in
Weight: 3782.4 oz

## 2019-08-29 LAB — PROTIME-INR
INR: 1.1 (ref 0.8–1.2)
Prothrombin Time: 13.7 seconds (ref 11.4–15.2)

## 2019-08-29 IMAGING — DX DG CHEST 2V
2 series · 2 of 2 positions shown · non-contrast
Comparison: Chest radiograph dated [DATE].

CLINICAL DATA: 58-year-old male with chest pain.

EXAM:
CHEST - 2 VIEW

[w chest pa]
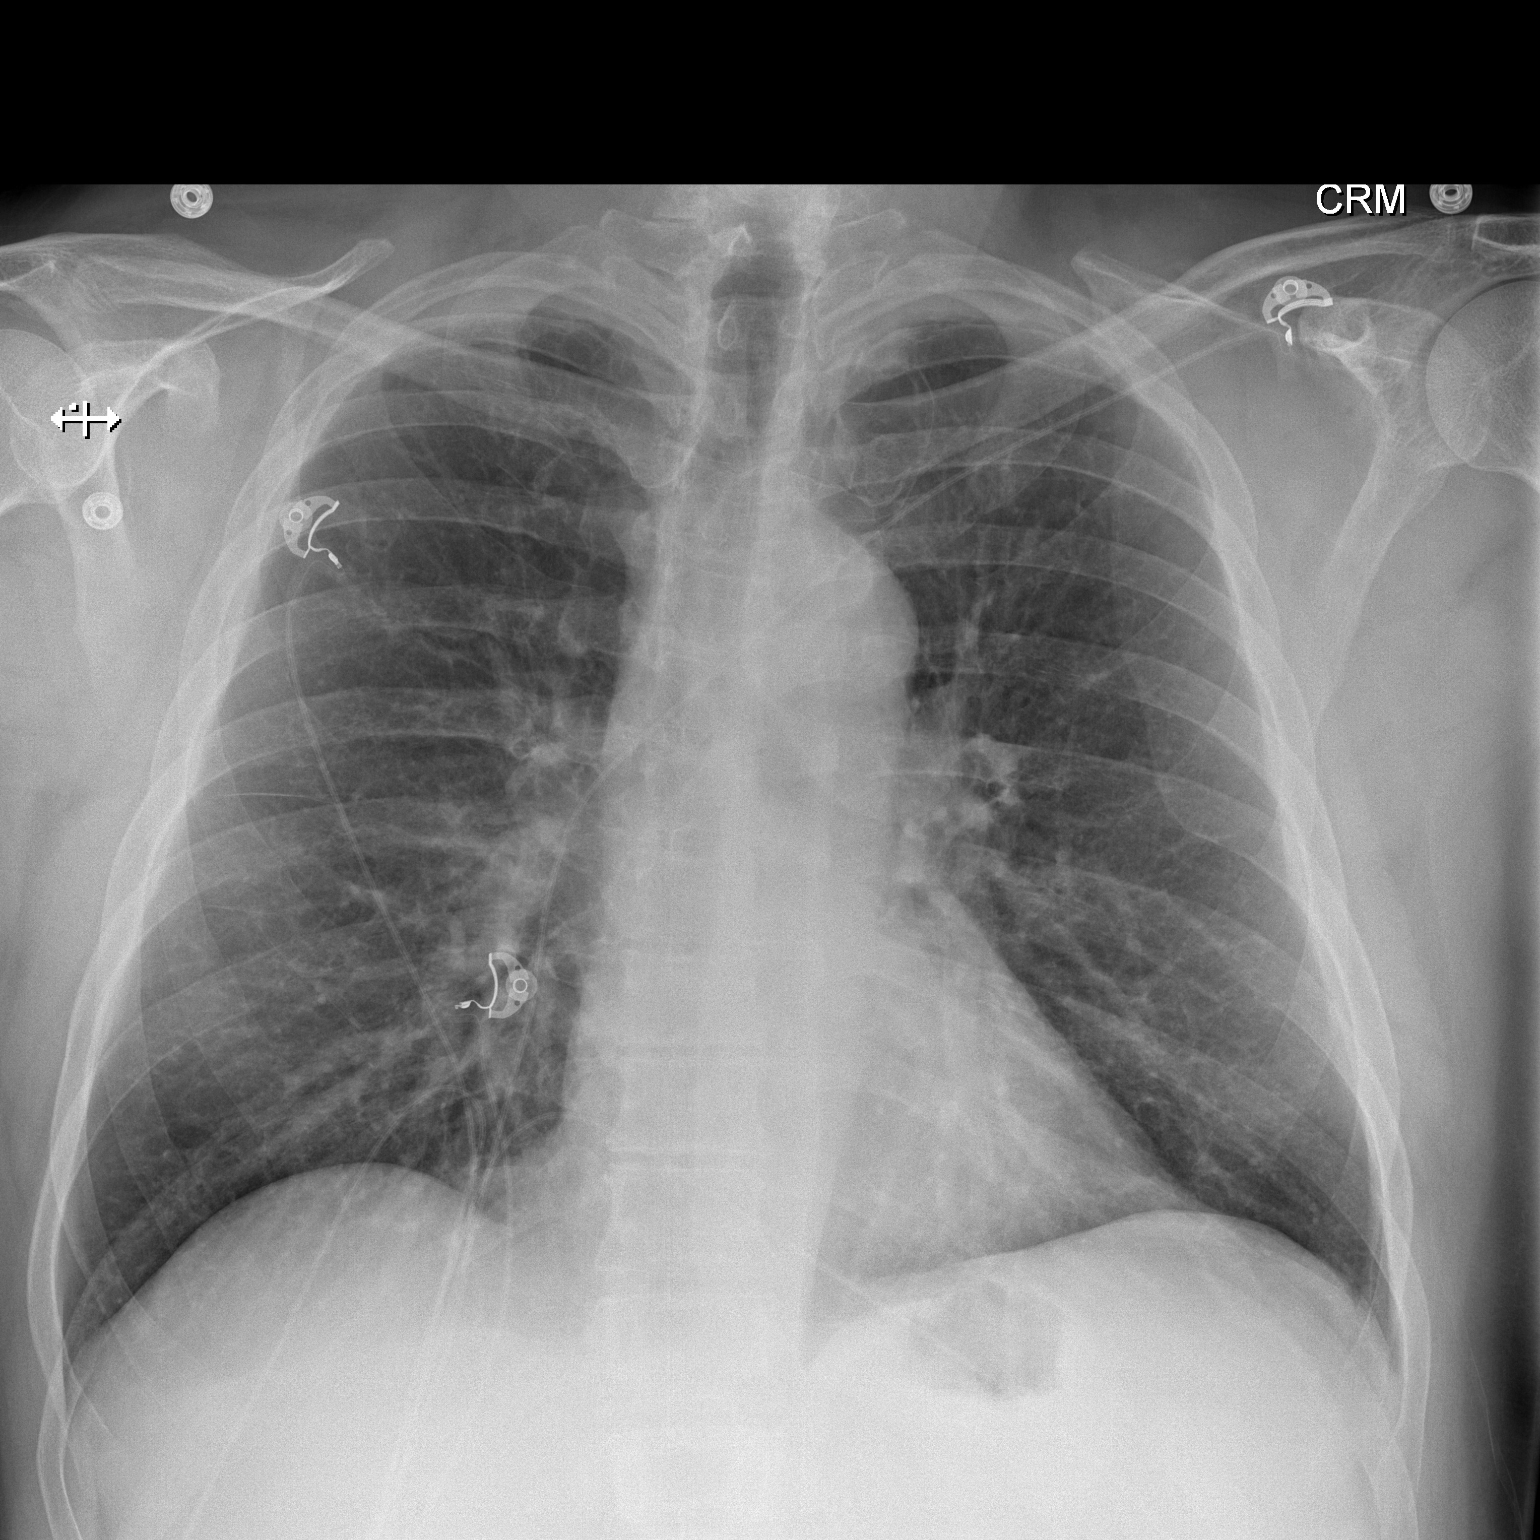

[w chest lat]
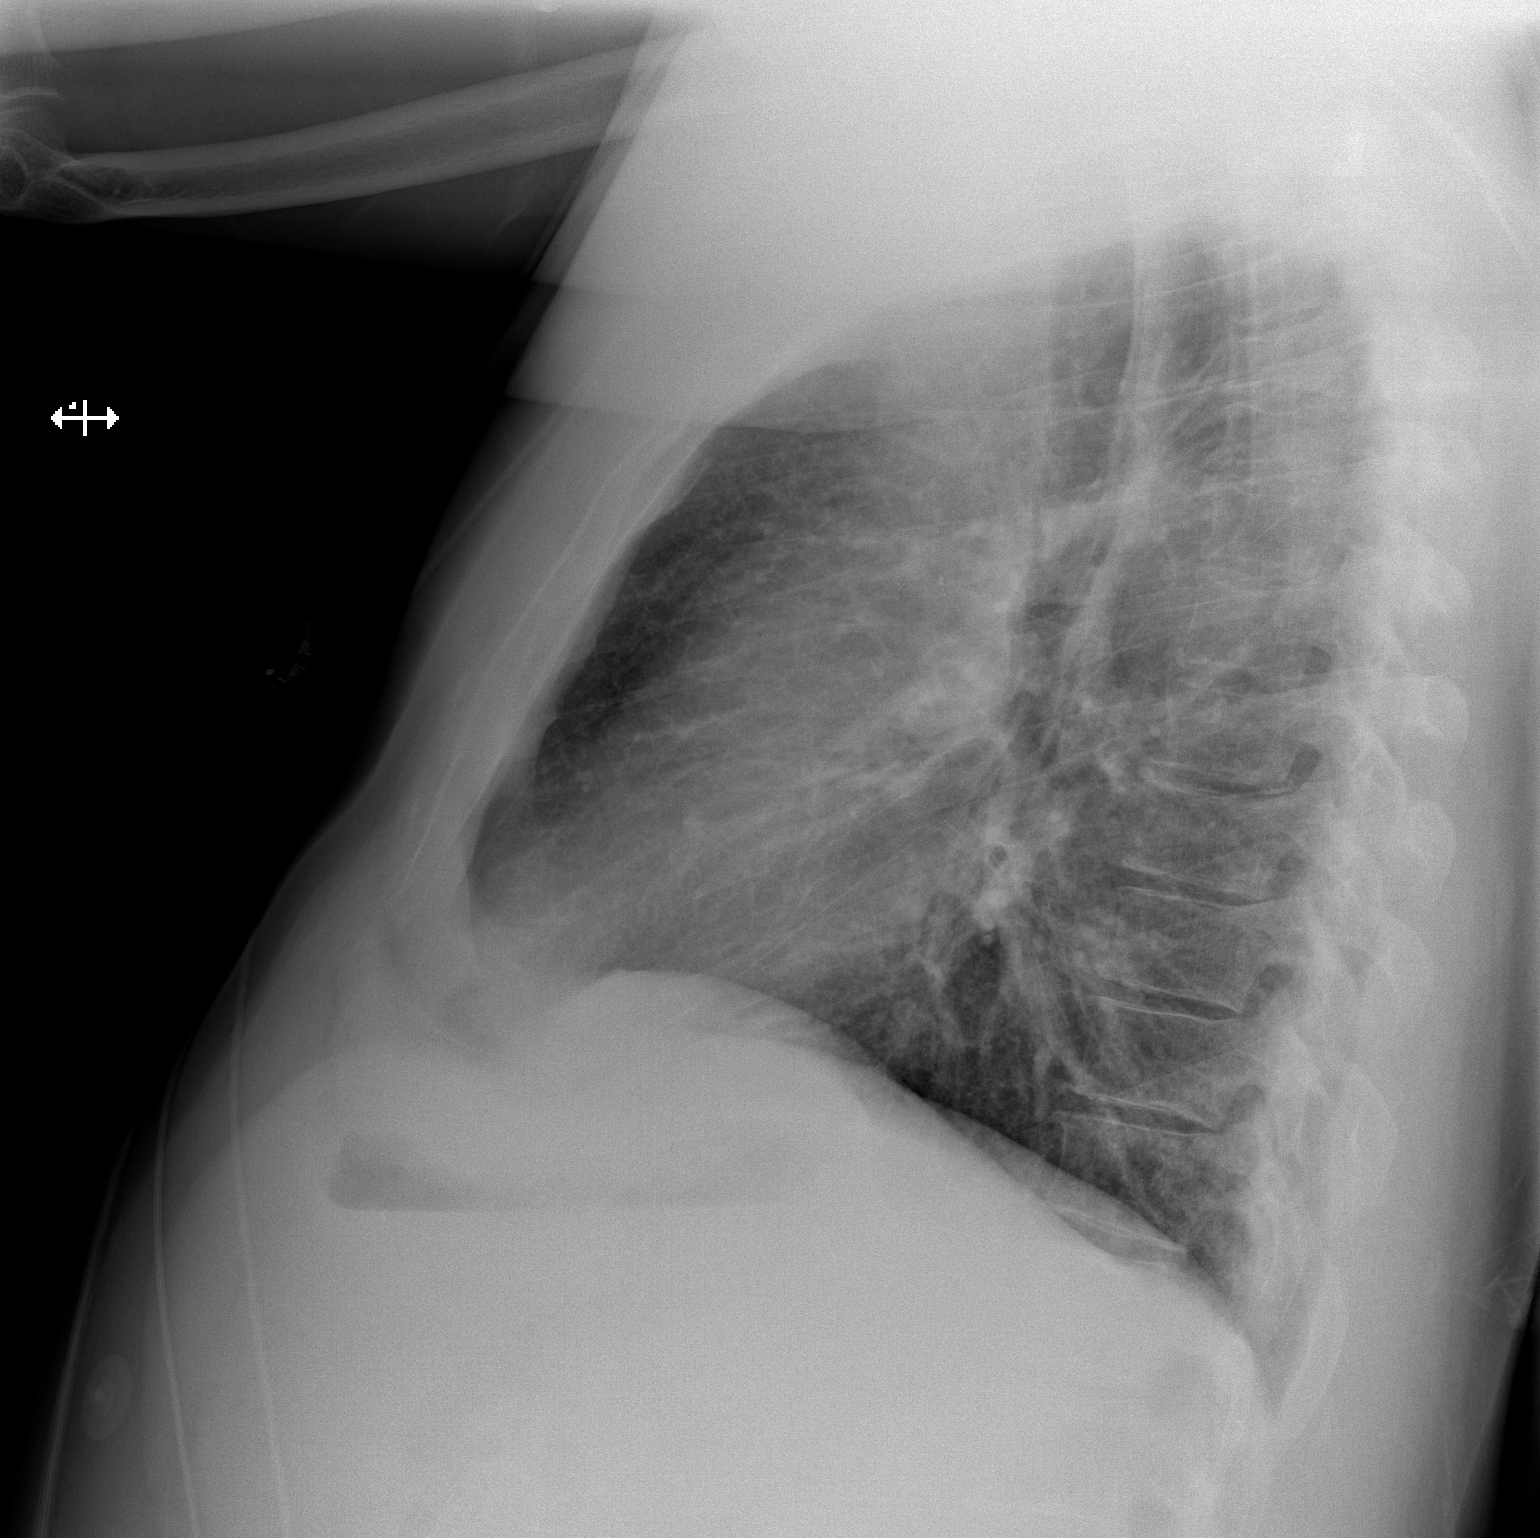

[2 of 2 positions shown; findings below may reference images not displayed]

FINDINGS: There is no focal consolidation, pleural effusion, or pneumothorax.
Background of emphysema. The cardiac silhouette is within normal
limits. No acute osseous pathology.
IMPRESSION: No active cardiopulmonary disease.

## 2019-08-29 MED ORDER — AZELASTINE HCL 0.1 % NA SOLN: 2.0000 | Freq: Two times a day (BID) | NASAL | Status: DC

## 2019-08-29 MED ORDER — GABAPENTIN 300 MG PO CAPS
300.0000 mg | ORAL_CAPSULE | Freq: Two times a day (BID) | ORAL | Status: DC
  Administered 2019-08-30: 300 mg via ORAL
  Filled 2019-08-29 (×3): qty 1

## 2019-08-29 MED ORDER — ALBUTEROL SULFATE (2.5 MG/3ML) 0.083% IN NEBU: 2.5000 mg | INHALATION_SOLUTION | RESPIRATORY_TRACT | Status: DC | PRN

## 2019-08-29 MED ORDER — SODIUM CHLORIDE 0.9% FLUSH
3.0000 mL | Freq: Two times a day (BID) | INTRAVENOUS | Status: DC
  Administered 2019-08-29 – 2019-08-30 (×2): 3 mL via INTRAVENOUS

## 2019-08-29 MED ORDER — NITROGLYCERIN 0.4 MG SL SUBL: 0.4000 mg | SUBLINGUAL_TABLET | SUBLINGUAL | Status: DC | PRN

## 2019-08-29 MED ORDER — ONDANSETRON HCL 4 MG/2ML IJ SOLN: 4.0000 mg | Freq: Four times a day (QID) | INTRAMUSCULAR | Status: DC | PRN

## 2019-08-29 MED ORDER — BUSPIRONE HCL 5 MG PO TABS
15.0000 mg | ORAL_TABLET | Freq: Three times a day (TID) | ORAL | Status: DC
  Administered 2019-08-29 – 2019-08-30 (×4): 15 mg via ORAL
  Filled 2019-08-29 (×4): qty 1

## 2019-08-29 MED ORDER — ASPIRIN EC 81 MG PO TBEC
81.0000 mg | DELAYED_RELEASE_TABLET | Freq: Every day | ORAL | Status: DC
  Administered 2019-08-29 – 2019-08-30 (×2): 81 mg via ORAL
  Filled 2019-08-29 (×2): qty 1

## 2019-08-29 MED ORDER — CLONAZEPAM 1 MG PO TABS
2.0000 mg | ORAL_TABLET | Freq: Every day | ORAL | Status: DC
  Administered 2019-08-29 – 2019-08-30 (×2): 2 mg via ORAL
  Filled 2019-08-29 (×2): qty 2

## 2019-08-29 MED ORDER — LAMOTRIGINE 25 MG PO TABS
25.0000 mg | ORAL_TABLET | Freq: Every day | ORAL | Status: DC
  Filled 2019-08-29 (×2): qty 1

## 2019-08-29 MED ORDER — PANTOPRAZOLE SODIUM 40 MG PO TBEC
40.0000 mg | DELAYED_RELEASE_TABLET | Freq: Two times a day (BID) | ORAL | Status: DC
  Administered 2019-08-29 – 2019-08-30 (×3): 40 mg via ORAL
  Filled 2019-08-29 (×3): qty 1

## 2019-08-29 MED ORDER — MONTELUKAST SODIUM 10 MG PO TABS
10.0000 mg | ORAL_TABLET | Freq: Every day | ORAL | Status: DC
  Administered 2019-08-29: 10 mg via ORAL
  Filled 2019-08-29: qty 1

## 2019-08-29 MED ORDER — FLUOXETINE HCL 20 MG PO CAPS
20.0000 mg | ORAL_CAPSULE | Freq: Every day | ORAL | Status: DC
  Administered 2019-08-29 – 2019-08-30 (×2): 20 mg via ORAL
  Filled 2019-08-29 (×2): qty 1

## 2019-08-29 MED ORDER — FLUTICASONE FUROATE-VILANTEROL 100-25 MCG/INH IN AEPB
1.0000 | INHALATION_SPRAY | Freq: Every day | RESPIRATORY_TRACT | Status: DC
  Filled 2019-08-29: qty 28

## 2019-08-29 MED ORDER — METOPROLOL TARTRATE 12.5 MG HALF TABLET
12.5000 mg | ORAL_TABLET | Freq: Two times a day (BID) | ORAL | Status: DC
  Administered 2019-08-29: 12.5 mg via ORAL
  Filled 2019-08-29: qty 1

## 2019-08-29 MED ORDER — LAMOTRIGINE 25 MG PO TABS
200.0000 mg | ORAL_TABLET | Freq: Every day | ORAL | Status: DC
  Administered 2019-08-29 – 2019-08-30 (×2): 200 mg via ORAL
  Filled 2019-08-29 (×2): qty 8

## 2019-08-29 MED ORDER — HEPARIN (PORCINE) 25000 UT/250ML-% IV SOLN
1100.0000 [IU]/h | INTRAVENOUS | Status: DC
  Administered 2019-08-29: 1100 [IU]/h via INTRAVENOUS

## 2019-08-29 MED ORDER — ACETAMINOPHEN 325 MG PO TABS
650.0000 mg | ORAL_TABLET | ORAL | Status: DC | PRN
  Administered 2019-08-29: 650 mg via ORAL
  Filled 2019-08-29: qty 2

## 2019-08-29 MED ORDER — LEVOCETIRIZINE DIHYDROCHLORIDE 5 MG PO TABS: 5.0000 mg | ORAL_TABLET | Freq: Every day | ORAL | Status: DC

## 2019-08-29 MED ORDER — DOXEPIN HCL 50 MG PO CAPS
100.0000 mg | ORAL_CAPSULE | Freq: Every day | ORAL | Status: DC
  Administered 2019-08-29: 22:00:00 100 mg via ORAL
  Filled 2019-08-29: qty 2
  Filled 2019-08-29: qty 1
  Filled 2019-08-29: qty 2

## 2019-08-29 MED ORDER — SODIUM CHLORIDE 0.9 % IV SOLN: 250.0000 mL | INTRAVENOUS | Status: DC | PRN

## 2019-08-29 MED ORDER — SODIUM CHLORIDE 0.9% FLUSH: 3.0000 mL | INTRAVENOUS | Status: DC | PRN

## 2019-08-29 MED ORDER — METOPROLOL TARTRATE 25 MG PO TABS
25.0000 mg | ORAL_TABLET | Freq: Two times a day (BID) | ORAL | Status: DC
  Administered 2019-08-29 – 2019-08-30 (×2): 25 mg via ORAL
  Filled 2019-08-29 (×2): qty 1

## 2019-08-29 MED ORDER — ATORVASTATIN CALCIUM 40 MG PO TABS
40.0000 mg | ORAL_TABLET | Freq: Every day | ORAL | Status: DC
  Administered 2019-08-29 – 2019-08-30 (×2): 40 mg via ORAL
  Filled 2019-08-29 (×2): qty 1

## 2019-08-29 NOTE — Progress Notes (Signed)
ANTICOAGULATION CONSULT NOTE - Initial Consult  Pharmacy Consult for heparin Indication: chest pain/ACS  Allergies  Allergen Reactions  . Codeine Other (See Comments)    ask  . Morphine Other (See Comments)    ask  . Other     Lemons, Limes, Mushrooms  Has to have allergy shots  . Sulfa Antibiotics Other (See Comments)    IBS    Patient Measurements: Height: 5\' 11"  (180.3 cm) Weight: 236 lb 6.4 oz (107.2 kg) IBW/kg (Calculated) : 75.3 Heparin Dosing Weight: 98kg  Vital Signs: Temp: 96.9 F (36.1 C) (02/23 0637) Temp Source: Axillary (02/23 12-14-1985) BP: 117/83 (02/23 12-14-1985) Pulse Rate: 83 (02/23 0637)  Labs: No results for input(s): HGB, HCT, PLT, APTT, LABPROT, INR, HEPARINUNFRC, HEPRLOWMOCWT, CREATININE, CKTOTAL, CKMB, TROPONINIHS in the last 72 hours.  CrCl cannot be calculated (No successful lab value found.).   Medical History: Past Medical History:  Diagnosis Date  . Acid reflux   . Asthma   . Barrett's esophagus   . Bipolar disorder (HCC)   . CAD (coronary artery disease)   . Chiari I malformation (HCC)   . Dyslipidemia   . GERD (gastroesophageal reflux disease)   . Heart attack (HCC)    x2  . History of angina   . Manic depression (HCC)   . Migraine   . PTSD (post-traumatic stress disorder)    since teenager   . Vertigo      Assessment: 14 yoM transferred from OSH, pharmacy to manage IV heparin for ACS r/o. OSH chart showed normal CBC and coags, negative troponin, CT chest negative for PE, and CT head negative for acute abnormality. Heparin infusing at 1100 units/h - appears to have started ~0600 this am.   Goal of Therapy:  Heparin level 0.3-0.7 units/ml Monitor platelets by anticoagulation protocol: Yes   Plan:  -Continue heparin 1100 units/h -Check heparin level 6-8h after starting -Daily heparin level and CBC   45, PharmD, BCPS Clinical Pharmacist [phone] Please check AMION for all [facility] Pharmacy numbers 08/29/2019

## 2019-08-29 NOTE — H&P (Addendum)
History and Physical    LAURIS SERVISS GBT:[id number] DOB: 1961/02/19 DOA: 08/29/2019  PCP: Yisroel Ramming, MD Consultants:  Judithe Modest - cardiology; Padgett - allergy; Lucia Gaskins- neurology Patient coming from:  Home - lives with wife; NOK: [place], [place], [phone] (wrong number)   Chief Complaint: chest pain  HPI: Reginald Nguyen is a 59 y.o. male with medical history significant of PTSD; bipolar d/o; HLD; CAD s/p stent (2007); COPD; and Chiari I malformation who presented to [facility] with CP x 3 days.  He reports that he has been hurting for 3 days but it got so bad last night he couldn't even talk, just mumbled.  He went to [facility] and they suggested transfer.  Substernal pain, c/w his prior angina.  He was having stinging pain with clammy, sweaty, legs felt drained "like I've walked 20 miles."  No SOB.  He was at his friend's house watching tv when he started; it was worse with exertion.  It would wax and wane but got worse last night.  It was just so bad he couldn't walk or talk right.  The pain resolved with NTG at [facility] and the pain resolved.  Now with a dull discomfort in his chest.    RH transfer, per Dr. Loney Loh:   Chest pain exertional and initially improved with rest but tonight not resolving with rest.  Sublingual nitroglycerin did not help.  Troponin negative and EKG without acute ischemic changes.  Cardiologist at [facility] concerned that this is unstable angina and recommended transfer to a PCI capable facility.  Chest pain resolved after nitro patch was placed.  Covid negative.  No heparin started.  Hemoglobin stable and no contraindication to heparin use reported, as such, advised ED provider to start heparin for unstable angina.  Consult cardiology on arrival.  Review of Systems: As per HPI; otherwise review of systems reviewed and negative.   Ambulatory Status:  Ambulates without assistance  Past Medical History:  Diagnosis Date  . Acid reflux   . Asthma   . Barrett's esophagus   . Bipolar  disorder (HCC)   . CAD (coronary artery disease)   . Chiari I malformation (HCC)   . Dyslipidemia   . GERD (gastroesophageal reflux disease)   . Heart attack (HCC)    x2  . History of angina   . Manic depression (HCC)   . Migraine   . PTSD (post-traumatic stress disorder)    since teenager   . Vertigo     Past Surgical History:  Procedure Laterality Date  . EYE SURGERY Bilateral   . HEMORRHOID SURGERY      Social History   Socioeconomic History  . Marital status: Married    Spouse name: Not on file  . Number of children: 1  . Years of education: Not on file  . Highest education level: GED or equivalent  Occupational History  . Occupation: disabled  Tobacco Use  . Smoking status: Current Every Day Smoker    Packs/day: 1.50    Years: 44.00    Pack years: 66.00    Types: Cigarettes    Start date: 35  . Smokeless tobacco: Former Neurosurgeon    Types: Snuff  . Tobacco comment: used to smoke cigars & pipes  Substance and Sexual Activity  . 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 Drug use: Not Currently  . Sexual activity: Not on file  Other Topics Concern  . Not on file  Social History  Narrative   Lives in an apt with his wife   Right handed   Caffeine: coffee 2 cups daily, root beer occasional    Social Determinants of Health   Financial Resource Strain:   . Difficulty of Paying Living Expenses: Not on file  Food Insecurity:   . Worried About Programme researcher, broadcasting/film/video in the Last Year: Not on file  . Ran Out of Food in the Last Year: Not on file  Transportation Needs:   . Lack of Transportation (Medical): Not on file  . Lack of Transportation (Non-Medical): Not on file  Physical Activity:   . Days of Exercise per Week: Not on file  . Minutes of Exercise per Session: Not on file  Stress:   . Feeling of Stress : Not on file  Social Connections:   . Frequency of Communication with Friends and Family: Not on file  . Frequency of Social  Gatherings with Friends and Family: Not on file  . Attends Religious Services: Not on file  . Active Member of Clubs or Organizations: Not on file  . Attends Banker Meetings: Not on file  . Marital Status: Not on file  Intimate Partner Violence:   . Fear of Current or Ex-Partner: Not on file  . Emotionally Abused: Not on file  . Physically Abused: Not on file  . Sexually Abused: Not on file    Allergies  Allergen Reactions  . Codeine Other (See Comments)    ask  . Morphine Other (See Comments)    ask  . Other     Lemons, Limes, Mushrooms  Has to have allergy shots  . Sulfa Antibiotics Other (See Comments)    IBS    Family History  Problem Relation Age of Onset  . Hypertension Father   . Heart Problems Father   . Other Father        kidney problems  . Stroke Father   . Cancer Maternal Grandmother   . Cancer Maternal Grandfather   . Cancer Paternal Grandmother   . Cancer Paternal Grandfather   . Cancer Sister        nose, cervical cancer, ear x2, breast x 2  . Migraines Maternal Aunt   . Other Maternal Aunt        sinus headaches    Prior to Admission medications   Medication Sig Start Date End Date Taking? Authorizing Provider  albuterol (VENTOLIN HFA) 108 (90 Base) MCG/ACT inhaler Take 2 puffs by mouth every 4-6 hours as needed for coughing or shortness of breath. 02/20/19   [provider]  ASPIRIN 81 PO Take 1 tablet by mouth daily.    [provider]  atorvastatin (LIPITOR) 10 MG tablet Take 10 mg by mouth daily. 01/25/17   [provider]  azelastine (ASTELIN) 0.1 % nasal spray Can use two sprays in each nostril one to two times daily as directed. 04/11/19   Marcelyn Bruins, MD  BREO ELLIPTA 100-25 MCG/INH AEPB Take 1 inhalation once a day 03/17/19   [provider]  busPIRone (BUSPAR) 15 MG tablet TAKE ONE TABLET BY MOUTH EVERY DAY and TAKE ONE TABLET BY MOUTH DAIILY AT 3 PM and TAKE ONE TABLET AT BEDTIME  03/17/19   [provider]  clonazePAM (KLONOPIN) 2 MG tablet Take one (1) tablet by mouth once a day, as needed 03/17/19   [provider]  doxepin (SINEQUAN) 50 MG capsule Take 100 mg by mouth at bedtime.  09/28/16  [provider]  fenofibrate (TRICOR) 145 MG tablet Take 145 mg by mouth daily.    [provider]  FLUoxetine (PROZAC) 20 MG capsule Take 20 mg by mouth daily. 03/17/19   [provider]  KRILL OIL PO Take by mouth.    [provider]  lamoTRIgine (LAMICTAL) 200 MG tablet Take one (1) tablet by mouth every morning 07/19/17   [provider]  lamoTRIgine (LAMICTAL) 25 MG tablet Take 25 mg by mouth daily.    [provider]  levocetirizine (XYZAL) 5 MG tablet Take 5 mg by mouth daily.    [provider]  meloxicam (MOBIC) 7.5 MG tablet Take 7.5 mg by mouth 2 (two) times daily as needed for pain.    [provider]  montelukast (SINGULAIR) 10 MG tablet Take 10 mg by mouth at bedtime. 03/17/19   [provider]  nitroGLYCERIN (NITROSTAT) 0.4 MG SL tablet Place 0.4 mg under the tongue. 05/06/16   [provider]  pantoprazole (PROTONIX) 40 MG tablet Take 40 mg by mouth 2 (two) times daily.  09/24/16   [provider]  triamcinolone cream (KENALOG) 0.1 % APPLY TWICE DAILY AS NEEDED TO INVOLVED AREAS FOR 1-2 WEEKS. 04/05/19   [provider]  Vitamin D, Ergocalciferol, (DRISDOL) 1.25 MG (50000 UT) CAPS capsule Take 50,000 Units by mouth once a week. 02/23/19   [provider]  VITAMIN E PO Take by mouth.    [provider]    Physical Exam: Vitals:   08/29/19 0637  BP: 117/83  Pulse: 83  Resp: 13  Temp: (!) 96.9 F (36.1 C)  TempSrc: Axillary  SpO2: 97%  Weight: 107.2 kg  Height: 5\' 11"  (1.803 m)     . General:  Appears calm and comfortable and is NAD . Eyes:   EOMI, normal lids, iris . ENT:  grossly normal hearing, lips & tongue, mmm;  edentulous . Neck:  no LAD, masses or thyromegaly . Cardiovascular:  RRR, no m/r/g. No LE edema.  Respiratory:   CTA bilaterally with no wheezes/rales/rhonchi.  Normal respiratory effort. . Abdomen:  soft, NT, ND, NABS . Skin:  no rash or induration seen on limited exam . Musculoskeletal:  grossly normal tone BUE/BLE, good ROM, no bony abnormality . Psychiatric:  eccentric mood and affect, speech fluent and appropriate, AOx3 . Neurologic:  CN 2-12 grossly intact, moves all extremities in coordinated fashion    Radiological Exams on Admission: No results found.  CXR with low volumes, ?multifocal opacity; central pulmonary pulmonary prominence (repeat pending)  Head CT negative   EKG: Independently reviewed.  NSR with rate 96; 1st degree AV block; nonspecific ST changes with pulmonary disease pattern  No ischemic changes while at [facility]   Labs on Admission: I have personally reviewed the available labs and imaging studies at the time of the admission.  Pertinent labs:   WBC 5.7 BUN 18/Creatinine 1.3/GFR 57 Glucose 193 Normal INR 1.1 Normal D-dimer Negative troponin x 2 BNP negative COVID negative   Assessment/Plan Principal Problem:   Unstable angina (HCC) Active Problems:   Dyslipidemia   Smoking   Bipolar 1 disorder (HCC)   COPD (chronic obstructive pulmonary disease) (HCC)   Unstable angina -Patient with substernal chest pain that came on acutely at rest a few days ago and has waxed and waned, possibly worse with exertion; it worsened last night and he came to the ER, where it improved with NTG. -2-3/3 typical symptoms suggestive of atypical vs.  Typical cardiac chest pain.  -CXR somewhat abnormal at [facility]; repeating with 2-view film here today.   -Initial cardiac enzymes negative x2; repeat HS troponin is pending. -EKG without apparent ischemia at [facility]; repeat EKG here is pending. -HEART score is 4.  -Patient was transferred overnight for admission to Progressive Care  Unit at [facility] on telemetry to further evaluate for ACS.  -Repeat HS troponin -Repeat EKG now and in AM -Start ASA 81 mg daily -Risk factor stratification with HgbA1c and FLP; will also check TSH and UDS -Cardiology consultation requested -NPO for possible cath -Patient is on a Heparin drip -He is generally pain free after NTG x 3 at [facility]  HTN -Will start low-dose Lopressor  HLD -Increase Lipitor from 10 mg to 40 mg daily for now -Lipids pending -Hold Tricor and fish oil due to limited inpatient utility  Hyperglycemia -Hyperglycemia today but A1c is 5.2 -Likely stress response -Coverage/medication should not be needed at this time  Bipolar -There may be a psychogenic/anxiety component to his issues -Continue home meds - Buspar, Klonopin, Doxepin, Prozac, Neurontin, Lamictal  COPD with tobacco dependence -No overt wheezing or COPD symptoms on admission -He is on allergy medications (Astelin, Allegra, Singulair) but no apparent COPD meds at this time -Tobacco Dependence: encourage cessation; this was discussed with the patient and should be reviewed on an ongoing basis.   -Patch declined by patient.    Note: This patient has been tested at [facility] and is negative for the novel coronavirus COVID-19.   DVT prophylaxis: Heparin drip Code Status:  Full - confirmed with patient Family Communication: None present;I attempted to reach his sister but the number listed in the chart was incorrect at the time of admission Disposition Plan:  Home once clinically improved Consults called: Cardiology  Admission status: Admit - It is my clinical opinion that admission to INPATIENT is reasonable and necessary because of the expectation that this patient will require hospital care that crosses at least 2 midnights to treat this condition based on the medical complexity of the problems presented.  Given the aforementioned information, the predictability of an adverse outcome is felt to be  significant.    Karmen Bongo MD [facility]   How to contact the [facility] Attending or Consulting provider [place] or covering provider during after hours [place], for this patient?  1. Check the care team in [facility] and look for a) attending/consulting TR[facility] provider listed and b) the [facility] team listed 2. Log into [email or web address] and use [place]'s universal password to access. If you do not have the password, please contact the hospital operator. 3. Locate the [facility] provider you are looking for under [facility] and page to a number that you can be directly reached. 4. If you still have difficulty reaching the provider, please page the [facility] (Director on Call) for the Hospitalists listed on amion for assistance.   08/29/2019, 9:07 AM

## 2019-08-29 NOTE — Consult Note (Addendum)
Cardiology Consultation:   Patient ID: [name] MRN: [id number]; DOB: 12/31/1960  Admit date: 08/29/2019 Date of Consult: 08/29/2019  Primary Care Provider: Yisroel Ramming, MD Primary Cardiologist: No primary care provider on file.  Primary Electrophysiologist:  None    Patient Profile:   Reginald Nguyen is a 59 y.o. 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 Ophelia Charter.  History of Present Illness:   Reginald Nguyen gets his cardiology care from [facility] in [place]. As mentioned above, he had a cardiac cath in 2007 which showed nonobstructive disease and a myocardial perfusion scan in 2019 that did not show evidence of any significant ischemia.  He was most recently seen on 07/21/19. At that time, he had been experiencing chest discomfort over the last month that was nonexertional. He was offered the options of medical therapy, stress testing, or proceeding with cardiac catheterization. He preferred to continue medically managing and would likely proceed with cath if pain persisted.   Patient presented with intermittent chest pain that progressively worsened over the last 3 days. He states the pain was similar to what he has felt the last couple of months, but worse. Pain that brought him into the ED did not resolve and lasted approximately half the day. Pain is located on left side, described as sharp that worsens with exertion. Endorses associated shortness of breath, diaphoresis and palpitations. No nausea, vomiting, radiation into jaw or arm. Episodes of pain last for a few minutes.   On arrival to [facility], initial troponin was negative and EKG did not show any acute ischemic changes (via report, do not have access to see this). Cardiologist at [facility] was concerned this may be [place] and recommended transfer to a PCI capable facility. Heparin gtt  initiated, and he was transferred to [facility]. High sensitivity troponin negative. CBC unremarkable, CMP with creatinine slightly elevated at 1.38, otherwise unremarkalbe. Lipid panel with LDL 96, HDL 48.   Reginald Nguyen continues to smoke a pack per day. He is compliant with his medications.    Heart Pathway Score:  HEAR Score: 4  Past Medical History:  Diagnosis Date  . Acid reflux   . Asthma   . Barrett's esophagus   . Bipolar disorder (HCC)   . CAD (coronary artery disease)   . Chiari I malformation (HCC)   . Dyslipidemia   . GERD (gastroesophageal reflux disease)   . Heart attack (HCC)    x2  . History of angina   . Manic depression (HCC)   . Migraine   . PTSD (post-traumatic stress disorder)    since teenager   . Vertigo     Past Surgical History:  Procedure Laterality Date  . EYE SURGERY Bilateral   . HEMORRHOID SURGERY       Home Medications:  Prior to Admission medications   Medication Sig Start Date End Date Taking? Authorizing Provider  ASPIRIN 81 PO Take 162 mg by mouth in the morning and at bedtime.    Yes [provider]  atorvastatin (LIPITOR) 10 MG tablet Take 10 mg by mouth daily. 01/25/17  Yes [provider]  azelastine (ASTELIN) 0.1 % nasal spray Can use two sprays in each nostril one to two times daily as directed. Patient taking differently: Place 1-2 sprays into both nostrils See admin instructions. Once to twice daily as  needed for allergies. 04/11/19  Yes Padgett, Pilar Grammes, MD  busPIRone (BUSPAR) 15 MG tablet Take 15 mg by mouth 3 (three) times daily.  03/17/19  Yes [provider]  clonazePAM (KLONOPIN) 2 MG tablet Take 2 mg by mouth at bedtime.  03/17/19  Yes [provider]  doxepin (SINEQUAN) 50 MG capsule Take 100 mg by mouth at bedtime.  09/28/16  Yes [provider]  fenofibrate (TRICOR) 145 MG tablet Take 145 mg by mouth daily.   Yes [provider]  fexofenadine (ALLEGRA) 60 MG tablet Take 120  mg by mouth at bedtime.    Yes [provider]  FLUoxetine (PROZAC) 20 MG capsule Take 20 mg by mouth daily. 03/17/19  Yes [provider]  gabapentin (NEURONTIN) 300 MG capsule Take 300 mg by mouth 2 (two) times daily. 08/09/19  Yes [provider]  ketoconazole (NIZORAL) 2 % shampoo Apply 1 application topically every other day. 06/05/19  Yes [provider]  KRILL OIL PO Take 1 capsule by mouth daily.    Yes [provider]  lamoTRIgine (LAMICTAL) 200 MG tablet Take 200 mg by mouth daily.  07/19/17  Yes [provider]  lamoTRIgine (LAMICTAL) 25 MG tablet Take 25 mg by mouth daily.   Yes [provider]  meloxicam (MOBIC) 7.5 MG tablet Take 7.5 mg by mouth 2 (two) times daily as needed for pain.   Yes [provider]  montelukast (SINGULAIR) 10 MG tablet Take 10 mg by mouth in the morning and at bedtime.  03/17/19  Yes [provider]  nitroGLYCERIN (NITROSTAT) 0.4 MG SL tablet Place 0.4 mg under the tongue. 05/06/16  Yes [provider]  Omega-3 Fatty Acids (FISH OIL) 1000 MG CAPS Take 1 capsule by mouth at bedtime.   Yes [provider]  pantoprazole (PROTONIX) 40 MG tablet Take 40 mg by mouth 2 (two) times daily.  09/24/16  Yes [provider]  triamcinolone cream (KENALOG) 0.1 % 1 application 2 (two) times daily. For 1-2 weeks 04/05/19  Yes [provider]  VITAMIN E PO Take 1 capsule by mouth in the morning and at bedtime.    Yes [provider]    Inpatient Medications: Scheduled Meds: . aspirin EC  81 mg Oral Daily  . atorvastatin  40 mg Oral Daily  . busPIRone  15 mg Oral TID  . clonazePAM  2 mg Oral Daily  . doxepin  100 mg Oral QHS  . FLUoxetine  20 mg Oral Daily  . fluticasone furoate-vilanterol  1 puff Inhalation Daily  . gabapentin  300 mg Oral BID  . lamoTRIgine  200 mg Oral Daily  . lamoTRIgine  25 mg Oral Daily  . metoprolol tartrate  12.5 mg Oral BID    . montelukast  10 mg Oral QHS  . pantoprazole  40 mg Oral BID  . sodium chloride flush  3 mL Intravenous Q12H   Continuous Infusions: . sodium chloride    . heparin     PRN Meds: sodium chloride, acetaminophen, albuterol, nitroGLYCERIN, ondansetron (ZOFRAN) IV, sodium chloride flush  Allergies:    Allergies  Allergen Reactions  . Codeine Other (See Comments)    ask  . Morphine Other (See Comments)    ask  . Other     Lemons, Limes, Mushrooms  Has to have allergy shots  . Sulfa Antibiotics Other (See Comments)    IBS    Social History:   Social History   Socioeconomic  History  . Marital status: Married    Spouse name: Not on file  . Number of children: 1  . Years of education: Not on file  . Highest education level: GED or equivalent  Occupational History  . Occupation: disabled  Tobacco Use  . Smoking status: Current Every Day Smoker    Packs/day: 1.50    Years: 44.00    Pack years: 66.00    Types: Cigarettes    Start date: 62  . Smokeless tobacco: Former Neurosurgeon    Types: Snuff  . Tobacco comment: used to smoke cigars & pipes  Substance and Sexual Activity  . 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 Drug use: Not Currently  . Sexual activity: Not on file  Other Topics Concern  . Not on file  Social History Narrative   Lives in an apt with his wife   Right handed   Caffeine: coffee 2 cups daily, root beer occasional    Social Determinants of Health   Financial Resource Strain:   . Difficulty of Paying Living Expenses: Not on file  Food Insecurity:   . Worried About Programme researcher, broadcasting/film/video in the Last Year: Not on file  . Ran Out of Food in the Last Year: Not on file  Transportation Needs:   . Lack of Transportation (Medical): Not on file  . Lack of Transportation (Non-Medical): Not on file  Physical Activity:   . Days of Exercise per Week: Not on file  . Minutes of Exercise per Session: Not on file  Stress:   . Feeling of  Stress : Not on file  Social Connections:   . Frequency of Communication with Friends and Family: Not on file  . Frequency of Social Gatherings with Friends and Family: Not on file  . Attends Religious Services: Not on file  . Active Member of Clubs or Organizations: Not on file  . Attends Banker Meetings: Not on file  . Marital Status: Not on file  Intimate Partner Violence:   . Fear of Current or Ex-Partner: Not on file  . Emotionally Abused: Not on file  . Physically Abused: Not on file  . Sexually Abused: Not on file    Family History:    Family History  Problem Relation Age of Onset  . Hypertension Father   . Heart Problems Father   . Other Father        kidney problems  . Stroke Father   . Cancer Maternal Grandmother   . Cancer Maternal Grandfather   . Cancer Paternal Grandmother   . Cancer Paternal Grandfather   . Cancer Sister        nose, cervical cancer, ear x2, breast x 2  . Migraines Maternal Aunt   . Other Maternal Aunt        sinus headaches     ROS:  Please see the history of present illness.  All other ROS reviewed and negative.     Physical Exam/Data:   Vitals:   08/29/19 0637  BP: 117/83  Pulse: 83  Resp: 13  Temp: (!) 96.9 F (36.1 C)  TempSrc: Axillary  SpO2: 97%  Weight: 107.2 kg  Height: 5\' 11"  (1.803 m)   No intake or output data in the 24 hours ending 08/29/19 1040 Last 3 Weights 08/29/2019 04/11/2019 09/29/2016  Weight (lbs) 236 lb 6.4 oz 238 lb 3.2 oz 207 lb 12.8 oz  Weight (kg) 107.23 kg 108.047 kg  94.257 kg     Body mass index is 32.97 kg/m.  General:  Well nourished, well developed, in no acute distress HEENT: normal Lymph: no adenopathy Neck: no JVD Endocrine:  No thryomegaly Vascular: No carotid bruits; FA pulses 2+ bilaterally without bruits  Cardiac:  normal S1, S2; RRR; no murmur  Lungs:  clear to auscultation bilaterally, no wheezing, rhonchi or rales  Abd: obese, soft, mildly tender in epigastric region,  no hepatomegaly  Ext: no edema Musculoskeletal:  No deformities, BUE and BLE strength normal and equal Skin: warm and dry  Neuro:  CNs 2-12 intact, no focal abnormalities noted Psych:  Normal affect   EKG:  The EKG was personally reviewed and demonstrates:  Sinus rhythm with 1st degree AV block  Telemetry:  Telemetry was personally reviewed and demonstrates: NSR with intermittent sinus tach   Relevant CV Studies:   Laboratory Data:  High Sensitivity Troponin:   Recent Labs  Lab 08/29/19 0809  TROPONINIHS 2     Chemistry Recent Labs  Lab 08/29/19 0809  NA 139  K 4.2  CL 102  CO2 26  GLUCOSE 174*  BUN 15  CREATININE 1.38*  CALCIUM 9.3  GFRNONAA 56*  GFRAA >60  ANIONGAP 11    Recent Labs  Lab 08/29/19 0809  PROT 6.2*  ALBUMIN 3.6  AST 41  ALT 34  ALKPHOS 56  BILITOT 0.8   Hematology Recent Labs  Lab 08/29/19 0809  WBC 4.7  RBC 4.41  HGB 13.8  HCT 40.7  MCV 92.3  MCH 31.3  MCHC 33.9  RDW 13.2  PLT 245   BNPNo results for input(s): BNP, PROBNP in the last 168 hours.  DDimer No results for input(s): DDIMER in the last 168 hours.   Radiology/Studies:  No results found.   HEAR Score (for undifferentiated chest pain):  HEAR Score: 4    Assessment and Plan:   1. Accelerated chest pain:  -patient reports 2 month history of intermittent chest pain with some typical features, that has progressively worsened over the last few days and now appears to have exertional component  -currently chest pain free after receiving NTG x3 at [facility] -most recent ischemic eval per chart review was myoview in 2019 which did not show any signs of ischemia. Previous cath in 2007 with nonobstructive CAD   -his troponins and EKG are reassuring. Given his history and risk factors, he does warrant further ischemic work-up while inpatient. Discussed options with patient regarding stress test, coronary CT or cath. Given his renal function and that he already received a  contrast load for CTA at [facility] would prefer to avoid further contrast load. Patient would like to proceed with stress test -will also order TTE -continue medical management with aspirin, statin and beta blocker.   2. HLD -LDL not at goal -agree with increased Lipitor from 10 to 40 mg     For questions or updates, please contact [place] Please consult [email or web address] for contact info under     Signed, Delice Bison, DO  08/29/2019 10:40 AM   Patient seen and examined.  Agree with above documentation.  Patient is a 59 year old male with a history of PTSD, bipolar disorder, nonobstructive CAD on cath in 2007, hyperlipidemia, tobacco use.  He is being seen today for evaluation of chest pain at the request of Dr. Lorin Mercy.  He had a cath done in 2007 that showed nonobstructive disease.  He follows with cardiology at [facility] in [place].  Underwent nuclear  stress test in 2019 that did not show any ischemia.  He was seen by his cardiologist on January 15.  Reported that he had been having nonexertional chest pain.  Cardiologist offered either continued medical management, stress test, or proceed with cath.  Patient preferred to medically manage.  He reports that his chest pain is progressively worsened.  States that over the last 3 days in particular his pain has become worse.  Reports that yesterday he had unremitting chest pain for 12 hours.  Describes pain as sharp, left-sided pain.  Pain lasted for about 12 hours and resolved after he received nitroglycerin in the ED at [facility].  Also underwent CTPA at [facility], which showed no PE.  He was transferred to [facility] for further evaluation.  EKG personally reviewed, shows sinus rhythm with first-degree AV block, rate 96, no ST/T abnormalities.  Telemetry personally reviewed, shows sinus rhythm with rate in 90s.  High-sensitivity troponins here are 2 then <2.  Labs are notable for creatinine 1.38 (unclear baseline, was 1.30 on presentation to [facility]  yesterday).  Given his presentation with continuous chest pain for 12 hours but negative high-sensitivity troponins, do not suspect an acute coronary syndrome.  However, he does have known nonobstructive CAD, as well as significant risk factors.  Recommend further evaluation.  Discussed that options would include cardiac catheterization, coronary CTA, or stress testing.  Given his elevated creatinine, and considering he received a contrast load last night with CTPA, would favor noninvasive stress testing.  Discussed with patient and he is agreeable to this plan.  Will check TTE today and plan for [facility] tomorrow.  We will continue aspirin 81 mg, atorvastatin 40 mg, metoprolol 25 mg twice daily.  Given low suspicion for ACS, will discontinue heparin drip.  Little Ishikawa, MD

## 2019-08-29 NOTE — Progress Notes (Signed)
  Echocardiogram 2D Echocardiogram has been performed.  Reginald Nguyen 08/29/2019, 1:50 PM

## 2019-08-30 ENCOUNTER — Other Ambulatory Visit: Payer: Self-pay | Admitting: Internal Medicine

## 2019-08-30 ENCOUNTER — Inpatient Hospital Stay (HOSPITAL_BASED_OUTPATIENT_CLINIC_OR_DEPARTMENT_OTHER): Payer: Medicare Other

## 2019-08-30 ENCOUNTER — Telehealth: Payer: Self-pay | Admitting: Physician Assistant

## 2019-08-30 DIAGNOSIS — F319 Bipolar disorder, unspecified: Secondary | ICD-10-CM

## 2019-08-30 DIAGNOSIS — E785 Hyperlipidemia, unspecified: Secondary | ICD-10-CM

## 2019-08-30 DIAGNOSIS — F17210 Nicotine dependence, cigarettes, uncomplicated: Secondary | ICD-10-CM | POA: Diagnosis not present

## 2019-08-30 DIAGNOSIS — R079 Chest pain, unspecified: Secondary | ICD-10-CM | POA: Diagnosis not present

## 2019-08-30 DIAGNOSIS — J449 Chronic obstructive pulmonary disease, unspecified: Secondary | ICD-10-CM

## 2019-08-30 DIAGNOSIS — I25110 Atherosclerotic heart disease of native coronary artery with unstable angina pectoris: Secondary | ICD-10-CM | POA: Diagnosis not present

## 2019-08-30 DIAGNOSIS — F17200 Nicotine dependence, unspecified, uncomplicated: Secondary | ICD-10-CM

## 2019-08-30 LAB — BASIC METABOLIC PANEL
Anion gap: 11 (ref 5–15)
BUN: 22 mg/dL — ABNORMAL HIGH (ref 6–20)
CO2: 25 mmol/L (ref 22–32)
Calcium: 9.1 mg/dL (ref 8.9–10.3)
Chloride: 103 mmol/L (ref 98–111)
Creatinine, Ser: 1.36 mg/dL — ABNORMAL HIGH (ref 0.61–1.24)
GFR calc Af Amer: >60 mL/min (ref >60)
GFR calc non Af Amer: 57 mL/min — ABNORMAL LOW (ref >60)
Glucose, Bld: 114 mg/dL — ABNORMAL HIGH (ref 70–99)
Potassium: 4.2 mmol/L (ref 3.5–5.1)
Sodium: 139 mmol/L (ref 135–145)

## 2019-08-30 LAB — CBC
HCT: 37.7 % — ABNORMAL LOW (ref 39.0–52.0)
Hemoglobin: 12.8 g/dL — ABNORMAL LOW (ref 13.0–17.0)
MCH: 31.2 pg (ref 26.0–34.0)
MCHC: 34 g/dL (ref 30.0–36.0)
MCV: 92 fL (ref 80.0–100.0)
Platelets: 223 10*3/uL (ref 150–400)
RBC: 4.1 MIL/uL — ABNORMAL LOW (ref 4.22–5.81)
RDW: 13.2 % (ref 11.5–15.5)
WBC: 9.4 10*3/uL (ref 4.0–10.5)
nRBC: 0 % (ref 0.0–0.2)

## 2019-08-30 LAB — HEPARIN LEVEL (UNFRACTIONATED): Heparin Unfractionated: <0.1 IU/mL — ABNORMAL LOW (ref 0.30–0.70)

## 2019-08-30 LAB — NM MYOCAR MULTI W/SPECT W/WALL MOTION / EF
Estimated workload: 1 METS
Exercise duration (min): 0 min
Peak HR: 82 {beats}/min
Rest HR: 67 {beats}/min

## 2019-08-30 LAB — PROTIME-INR
INR: 1.1 (ref 0.8–1.2)
Prothrombin Time: 13.6 seconds (ref 11.4–15.2)

## 2019-08-30 MED ORDER — METOPROLOL TARTRATE 25 MG PO TABS: 25.0000 mg | ORAL_TABLET | Freq: Two times a day (BID) | ORAL | 0 refills | Status: DC

## 2019-08-30 MED ORDER — REGADENOSON 0.4 MG/5ML IV SOLN
0.4000 mg | Freq: Once | INTRAVENOUS | Status: AC
  Administered 2019-08-30: 0.4 mg via INTRAVENOUS
  Filled 2019-08-30: qty 5

## 2019-08-30 MED ORDER — TECHNETIUM TC 99M TETROFOSMIN IV KIT
10.7000 | PACK | Freq: Once | INTRAVENOUS | Status: AC | PRN
  Administered 2019-08-30: 10.7 via INTRAVENOUS

## 2019-08-30 MED ORDER — REGADENOSON 0.4 MG/5ML IV SOLN
INTRAVENOUS | Status: AC
  Filled 2019-08-30: qty 5

## 2019-08-30 MED ORDER — BREO ELLIPTA 100-25 MCG/INH IN AEPB: INHALATION_SPRAY | RESPIRATORY_TRACT | 0 refills | Status: DC

## 2019-08-30 MED ORDER — ATORVASTATIN CALCIUM 40 MG PO TABS: 40.0000 mg | ORAL_TABLET | Freq: Every day | ORAL | 0 refills | Status: DC

## 2019-08-30 NOTE — Progress Notes (Signed)
Lexiscan Myoview shows small to moderate size area of mild reversibility within the distal inferolateral wall.  As stress test is not high risk, recommend medical management.     [facility] will sign off.   Medication Recommendations: Aspirin 81 mg daily, atorvastatin 40 mg daily, metoprolol 25 mg twice daily.  Can titrate metoprolol as outpatient and add Imdur if needed for further chest pain.  Can consider cardiac catheterization if has further chest pain despite optimal medical management.  Other recommendations (labs, testing, etc):  None Follow up as an outpatient: Follows with Dr. Judithe Modest through [facility]

## 2019-08-30 NOTE — Telephone Encounter (Signed)
   I received sign-out from team B APP to f/u on stress test and call patient with result this evening since patient requested discharge prior to test result coming back. Dr. Bjorn Pippin did a progress note at 17:03 today:  "Lexiscan Myoview shows small to moderate size area of mild reversibility within the distal inferolateral wall.  As stress test is not high risk, recommend medical management.     [facility] will sign off.   Medication Recommendations: Aspirin 81 mg daily, atorvastatin 40 mg daily, metoprolol 25 mg twice daily.  Can titrate metoprolol as outpatient and add Imdur if needed for further chest pain.  Can consider cardiac catheterization if has further chest pain despite optimal medical management.  Other recommendations (labs, testing, etc):  None Follow up as an outpatient: Follows with Dr. Judithe Modest through [facility]"  It's not clear to me if the patient was able to receive this information prior to DC. I tried to call him at home but got VM. Left message on machine that someone would call him tomorrow from our office to follow-up. Will route to [facility] triage to ensure patient is notified that stress test had abnormality on it but was low risk - Dr. Bjorn Pippin recommended medical management and should have close f/u with his primary cardiologist. If he has further chest pain despite optimal medical management, may need cath.  Yiannis Tulloch PA-C

## 2019-08-30 NOTE — Progress Notes (Signed)
   Reginald Nguyen presented for a nuclear stress test today.    Per protocol, test was proctored by nuc med staff. Per verbal discussion, patient tolerated well with mild shortness of breath. EKG tracings reviewed without acute change. Patient has since been transported back to room.  No immediate complications.  Stress imaging is pending at this time.  Preliminary EKG findings may be listed in the chart, but the stress test result will not be finalized until perfusion imaging is complete.  Laurann Montana, PA-C 08/30/2019, 12:44 PM

## 2019-08-30 NOTE — Social Work (Signed)
CSW consulted for transportation. CSW visit with the patient at bedside. Patient informed CSW he has no income or any transportation home. Patient states he needs to get home to his wife. Patient became emotional stating he  feels like he has abandon her. Patient states he is ready to get home and help take care of her.   Patient has signed Therapist, nutritional and Release of Liability- placed in chart.  RN will need to call [employer] @ [phone], when patient is ready for transport. After Hours (6pm) call  [phone]  CSW signing off.  Antony Blackbird, MSW, LCSWA Clinical Social Worker

## 2019-08-30 NOTE — Care Management Obs Status (Signed)
MEDICARE OBSERVATION STATUS NOTIFICATION   Patient Details  Name: Reginald Nguyen MRN: [id number] Date of Birth: 1961-02-14   Medicare Observation Status Notification Given:  Yes    Darrold Span, RN 08/30/2019, 4:15 PM

## 2019-08-30 NOTE — Progress Notes (Signed)
Plan of care reviewed. Pt is progressing. Independently ambulated to bathroom, denied chest pain, denied shortness of breath with exertion. Vital signs stable.    HR 70s - 80, EKG is sinus rhythm on monitor BP 98/59- 102/66 mmHg, RR 12-18,  SPO2 92% on room air,  Temp 97.8 F.   NPO after midnight for NM stress test at am. No incidents of acute distress noted tonight. Will continue to monitor.  Adonica Fukushima. RN

## 2019-08-30 NOTE — Progress Notes (Signed)
Pt picked up just now @0755  by transport to have ECHO done. Per repor-TIE,RN pt NPO since 0000.

## 2019-08-30 NOTE — Discharge Summary (Signed)
Physician Discharge Summary  Reginald Nguyen ZOX:[id number] DOB: Feb 07, 1961 DOA: 08/29/2019  PCP: Yisroel Ramming, MD  Admit date: 08/29/2019 Discharge date: 08/30/2019  Admitted From: Home Disposition: Home  Recommendations for Outpatient Follow-up:  1. Follow up with PCP in 1-2 weeks 2. Follow-up with cardiology within 1 to 2 weeks and continue aspirin 81 mg p.o. daily, atorvastatin 40 once a day and metoprolol 25 mg p.o. twice daily and titrate in outpatient setting and add Imdur if patient has further chest pain; can consider cardiac catheterization if he has further chest pain despite optimal medical management and will need to see Dr. Judithe Modest at [facility] 3. Please obtain CBC/CMP, mag, Phos in one week 4. Please follow up on the following pending results:  Home Health: No Equipment/Devices: None  Discharge Condition: Stable  CODE STATUS: FULL CODE Diet recommendation: Heart Healthy Diet   Brief/Interim Summary: HPI per Dr. Jonah Blue on 08/29/2019  REDDINGTON GRIECO is a 59 y.o. male with medical history significant of PTSD; bipolar d/o; HLD; CAD s/p stent (2007); COPD; and Chiari I malformation who presented to [facility] with CP x 3 days.  He reports that he has been hurting for 3 days but it got so bad last night he couldn't even talk, just mumbled.  He went to [facility] and they suggested transfer.  Substernal pain, c/w his prior angina.  He was having stinging pain with clammy, sweaty, legs felt drained "like I've walked 20 miles."  No SOB.  He was at his friend's house watching tv when he started; it was worse with exertion.  It would wax and wane but got worse last night.  It was just so bad he couldn't walk or talk right.  The pain resolved with NTG at [facility] and the pain resolved.  Now with a dull discomfort in his chest.    RH transfer, per Dr. Loney Loh:   Chest pain exertional and initially improved with rest but tonight not resolving with rest.  Sublingual nitroglycerin did not help.  Troponin  negative and EKG without acute ischemic changes.  Cardiologist at [place] [facility] concerned that this is unstable angina and recommended transfer to a PCI capable facility.  Chest pain resolved after nitro patch was placed.  Covid negative.  No heparin started.  Hemoglobin stable and no contraindication to heparin use reported, as such, advised ED provider to start heparin for unstable angina.  Consult cardiology on arrival.  **Interim History He underwent a Lexiscan Myoview stress test which did show small to moderate sized area of mild reversibility within the distal inferior lateral wall and was not read as a high risk.  Recommendations for medical management cardiology recommended aspirin 81 mg p.o., atorvastatin 40 g p.o., as well as metoprolol 55 mg p.o. twice daily at discharge and following up with his primary cardiologist Dr. Judithe Modest.  He was deemed stable for discharge and will need to follow-up with PCP and cardiology in 1 to 2 weeks   Discharge Diagnoses:  Principal Problem:   Unstable angina (HCC) Active Problems:   Dyslipidemia   Smoking   Bipolar 1 disorder (HCC)   COPD (chronic obstructive pulmonary disease) (HCC)   Chest pain  Chest pain rule out ACS in a patient with nonobstructive CAD cath in 2007 -Patient with substernal chest pain that came on acutely at rest a few days ago and has waxed and waned, possibly worse with exertion; it worsened last night and he came to the ER, where it improved with NTG. -2-3/3  typical symptoms suggestive of atypical vs. Typical cardiac chest pain.  -CXR somewhat abnormal at [facility]; repeating with 2-view film here today.  -Initial cardiac enzymes negative x2; repeat HS troponin was less than 2. -EKG without apparent ischemia at [facility]; -HEART score is 4.  -Patient was transferred overnight for admission to Progressive Care Unit at [facility] on telemetryto further evaluate for ACS.  -Repeat HS troponin as above -Start ASA 81 mg daily, statin and metoprolol -Risk  factor stratification with HgbA1c and FLP; will also check TSH and UDS -Was on a heparin drip but this is now stopped -He is generally pain free after NTG x 3 at [facility] -He underwent a Lexiscan Myoview stress test which did show small to moderate sized area of mild reversibility within the distal inferior lateral wall and was not read as a high risk. -Follow-up with cardiology in outpatient setting continue aspirin, statin as well as beta-blocker at discharge  HTN -Started on low-dose Lopressor 25 mg po Daily   HLD -Increase Lipitor from 10 mg to 40 mg daily for now and follow up with PCP -Hold Tricor and fish oil due to limited inpatient utility  Hyperglycemia -Hyperglycemia today but A1c is 5.2 -Likely stress response -Coverage/medication should not be needed at this time  Bipolar -There may be a psychogenic/anxiety component to his issues -Continue home meds - Buspar, Klonopin, Doxepin, Prozac, Neurontin, Lamictal  COPD with tobacco dependence -No overt wheezing or COPD symptoms on admission -He is on allergy medications (Astelin, Allegra, Singulair) but no apparent COPD meds at this time -Tobacco Dependence: encourage cessation; this was discussed with the patient and should be reviewed on an ongoing basis.   -Patch declined by patient.  Normocytic Anemia -Patient's hemoglobin/hematocrit went from 13.8/40.7 is now 12.8/37.7 -Continue monitor for signs and symptoms of bleeding and repeat CBC in the outpatient for further work-up  Obesity -Estimated body mass index is 32.97 kg/m as calculated from the following:   Height as of this encounter: 5\' 11"  (1.803 m).   Weight as of this encounter: 107.2 kg. -Weight Loss and Dietary Counseling given   Discharge Instructions  Discharge Instructions    Call MD for:  difficulty breathing, headache or visual disturbances   Complete by: As directed    Call MD for:  extreme fatigue   Complete by: As directed    Call MD for:  hives    Complete by: As directed    Call MD for:  persistant dizziness or light-headedness   Complete by: As directed    Call MD for:  persistant nausea and vomiting   Complete by: As directed    Call MD for:  redness, tenderness, or signs of infection (pain, swelling, redness, odor or green/yellow discharge around incision site)   Complete by: As directed    Call MD for:  severe uncontrolled pain   Complete by: As directed    Call MD for:  temperature >100.4   Complete by: As directed    Diet - low sodium heart healthy   Complete by: As directed    Diet - low sodium heart healthy   Complete by: As directed    Discharge instructions   Complete by: As directed    You were cared for by a hospitalist during your hospital stay. If you have any questions about your discharge medications or the care you received while you were in the hospital after you are discharged, you can call the unit and ask to speak with the  hospitalist on call if the hospitalist that took care of you is not available. Once you are discharged, your primary care physician will handle any further medical issues. Please note that NO REFILLS for any discharge medications will be authorized once you are discharged, as it is imperative that you return to your primary care physician (or establish a relationship with a primary care physician if you do not have one) for your aftercare needs so that they can reassess your need for medications and monitor your lab values.  Follow up with PCP and Cardiology in the outpatient. Follow up on Stress Test with Cardiology. Take all medications as prescribed. If symptoms change or worsen please return to the ED for evaluation   Increase activity slowly   Complete by: As directed    Increase activity slowly   Complete by: As directed      Allergies as of 08/30/2019      Reactions   Codeine Other (See Comments)   ask   Morphine Other (See Comments)   ask   Other    Lemons, Limes, Mushrooms   Has to have allergy shots   Sulfa Antibiotics Other (See Comments)   IBS      Medication List    STOP taking these medications   meloxicam 7.5 MG tablet Commonly known as: MOBIC     TAKE these medications   ASPIRIN 81 PO Take 162 mg by mouth in the morning and at bedtime. Notes to patient: This evening 2/24   atorvastatin 10 MG tablet Commonly known as: LIPITOR Take 10 mg by mouth daily. Notes to patient: Tomorrow morning 2/25   azelastine 0.1 % nasal spray Commonly known as: ASTELIN Can use two sprays in each nostril one to two times daily as directed. What changed:   how much to take  how to take this  when to take this  additional instructions Notes to patient: Take as you were at home   Breo Ellipta 100-25 MCG/INH Aepb Generic drug: fluticasone furoate-vilanterol Take 1 inhalation once a day Notes to patient: Tomorrow morning 2/25   busPIRone 15 MG tablet Commonly known as: BUSPAR Take 15 mg by mouth 3 (three) times daily. Notes to patient: This evening 2/24   clonazePAM 2 MG tablet Commonly known as: KLONOPIN Take 2 mg by mouth at bedtime. Notes to patient: This evening 2/24   doxepin 50 MG capsule Commonly known as: SINEQUAN Take 100 mg by mouth at bedtime. Notes to patient: This evening 2/24   fenofibrate 145 MG tablet Commonly known as: TRICOR Take 145 mg by mouth daily. Notes to patient: Tomorrow morning 2/25   fexofenadine 60 MG tablet Commonly known as: ALLEGRA Take 120 mg by mouth at bedtime. Notes to patient: This evening 2/24   Fish Oil 1000 MG Caps Take 1 capsule by mouth at bedtime. Notes to patient: This evening 2/24   FLUoxetine 20 MG capsule Commonly known as: PROZAC Take 20 mg by mouth daily. Notes to patient: Tomorrow morning 2/25   gabapentin 300 MG capsule Commonly known as: NEURONTIN Take 300 mg by mouth 2 (two) times daily. Notes to patient: This evening 2/24   ketoconazole 2 % shampoo Commonly known as:  NIZORAL Apply 1 application topically every other day. Notes to patient: Take as you were at home   KRILL OIL PO Take 1 capsule by mouth daily. Notes to patient: Tomorrow morning 2/25   lamoTRIgine 25 MG tablet Commonly known as: LAMICTAL Take 25 mg by mouth  daily. Notes to patient: Take this evening 2/24   lamoTRIgine 200 MG tablet Commonly known as: LAMICTAL Take 200 mg by mouth daily. Notes to patient: Take tomorrow morning 2/25   metoprolol tartrate 25 MG tablet Commonly known as: LOPRESSOR Take 1 tablet (25 mg total) by mouth 2 (two) times daily. Notes to patient: This evening 2/24   montelukast 10 MG tablet Commonly known as: SINGULAIR Take 10 mg by mouth in the morning and at bedtime. Notes to patient: This evening 2/24   nitroGLYCERIN 0.4 MG SL tablet Commonly known as: NITROSTAT Place 0.4 mg under the tongue. Notes to patient: As needed for chest pain   pantoprazole 40 MG tablet Commonly known as: PROTONIX Take 40 mg by mouth 2 (two) times daily. Notes to patient: This evening 2/24   triamcinolone cream 0.1 % Commonly known as: KENALOG 1 application 2 (two) times daily. For 1-2 weeks Notes to patient: Take as you were at home   VITAMIN E PO Take 1 capsule by mouth in the morning and at bedtime. Notes to patient: Tonight at bedtime 2/24      Follow-up Information    Yisroel Ramming, MD. Call.   Specialty: Internal Medicine Why: Follow up within 1-2 weeks  Contact information: [address] [place] [address] [phone]        Diamond Nickel., MD .   Specialty: Cardiology Contact information: [address] [place] [address] [phone]          Allergies  Allergen Reactions  . Codeine Other (See Comments)    ask  . Morphine Other (See Comments)    ask  . Other     Lemons, Limes, Mushrooms  Has to have allergy shots  . Sulfa Antibiotics Other (See Comments)    IBS    Consultations  Cardiology  Procedures/Studies: DG Chest 2 View  Result Date: 08/29/2019 CLINICAL DATA:  59 year old male with chest pain. EXAM: CHEST - 2 VIEW COMPARISON:  Chest radiograph dated 08/28/2019. FINDINGS: There is no focal consolidation, pleural effusion, or pneumothorax. Background of emphysema. The cardiac silhouette is within normal limits. No acute osseous pathology. IMPRESSION: No active cardiopulmonary disease. Electronically Signed   By: Elgie Collard M.D.   On: 08/29/2019 15:32   NM Myocar Multi W/Spect W/Wall Motion / EF  Result Date: 08/30/2019 CLINICAL DATA:  Chest pain EXAM: MYOCARDIAL IMAGING WITH SPECT (REST AND PHARMACOLOGIC-STRESS) GATED LEFT VENTRICULAR WALL MOTION STUDY LEFT VENTRICULAR EJECTION FRACTION TECHNIQUE: Standard myocardial SPECT imaging was performed after resting intravenous injection of 10.7 mCi Tc-57m tetrofosmin. Subsequently, intravenous infusion of Lexiscan was performed under the supervision of the Cardiology staff. At peak effect of the drug, 30 mCi Tc-14m tetrofosmin was injected intravenously and standard myocardial SPECT imaging was performed. Quantitative gated imaging was also performed to evaluate left ventricular wall motion, and estimate left ventricular ejection fraction. COMPARISON:  None. FINDINGS: Perfusion: There is a small to moderate size region of mild reversibility involving the distal inferolateral wall. Wall Motion: Normal left ventricular wall motion. No left ventricular dilation. Left Ventricular Ejection Fraction: 60 % End diastolic volume 101 ml End systolic volume 41 ml IMPRESSION: 1. Small to moderate size area of mild reversibility is noted within the distal inferolateral wall. 2. Normal left ventricular wall motion. 3. Left ventricular ejection fraction 60% 4. Non invasive risk stratification*: Intermediate *2012 Appropriate Use Criteria for Coronary Revascularization Focused Update: J Am Coll Cardiol. 2012;59(9):857-881.  http://content.dementiazones.com.aspx?articleid=1201161 These results will be called to  the ordering clinician or representative by the Radiologist Assistant, and communication documented in the PACS or zVision Dashboard. Electronically Signed   By: Kerby Moors M.D.   On: 08/30/2019 16:46   ECHOCARDIOGRAM COMPLETE  Result Date: 08/29/2019    ECHOCARDIOGRAM REPORT   Patient Name:   Prabhav B Monk Date of Exam: 08/29/2019 Medical Rec #:  [id number]    Height:       71.0 in Accession #:    [id number]   Weight:       236.4 lb Date of Birth:  [birth year]     BSA:          2.264 m Patient Age:    66 years     BP:           99/67 mmHg Patient Gender: M            HR:           92 bpm. Exam Location:  Inpatient Procedure: 2D Echo Indications:    786.50 chest pain  History:        Patient has no prior history of Echocardiogram examinations.                 CAD; Risk Factors:Dyslipidemia and Current Smoker. Heart attack.  Sonographer:    Jannett Celestine RDCS (AE) Referring Phys: [id number] Donato Heinz  Sonographer Comments: see comments. challenging apical windows IMPRESSIONS  1. Left ventricular ejection fraction, by estimation, is 60 to 65%. The left ventricle has normal function. The left ventricle has no regional wall motion abnormalities. Left ventricular diastolic parameters are consistent with Grade I diastolic dysfunction (impaired relaxation).  2. Right ventricular systolic function is normal. The right ventricular size is normal.  3. The mitral valve is normal in structure and function. No evidence of mitral valve regurgitation. No evidence of mitral stenosis.  4. The aortic valve is normal in structure and function. Aortic valve regurgitation is not visualized. No aortic stenosis is present.  5. Aortic dilatation noted. 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 FINDINGS  Left Ventricle: Left ventricular ejection fraction, by estimation, is 60 to 65%. The left ventricle has  normal function. The left ventricle has no regional wall motion abnormalities. The left ventricular internal cavity size was normal in size. There is  no left ventricular hypertrophy. Left ventricular diastolic parameters are consistent with Grade I diastolic dysfunction (impaired relaxation). Right Ventricle: The right ventricular size is normal. No increase in right ventricular wall thickness. Right ventricular systolic function is normal. Left Atrium: Left atrial size was normal in size. Right Atrium: Right atrial size was normal in size. Pericardium: There is no evidence of pericardial effusion. Mitral Valve: The mitral valve is normal in structure and function. No evidence of mitral valve regurgitation. No evidence of mitral valve stenosis. Tricuspid Valve: The tricuspid valve is grossly normal. Tricuspid valve regurgitation is not demonstrated. No evidence of tricuspid stenosis. Aortic Valve: The aortic valve is normal in structure and function. Aortic valve regurgitation is not visualized. No aortic stenosis is present. Pulmonic Valve: The pulmonic valve was normal in structure. Pulmonic valve regurgitation is not visualized. Aorta: Aortic dilatation noted. 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 IAS/Shunts: The atrial septum is grossly normal.  LEFT VENTRICLE PLAX 2D LVIDd:         4.41 cm LVIDs:         3.05 cm LV PW:  1.18 cm LV IVS:        1.13 cm LVOT diam:     2.30 cm LV SV:         51 LV SV Index:   22 LVOT Area:     4.15 cm  LEFT ATRIUM         Index LA diam:    3.20 cm 1.41 cm/m  AORTIC VALVE LVOT Vmax:   71.10 cm/s LVOT Vmean:  51.800 cm/s LVOT VTI:    0.122 m  AORTA Ao Root diam: 3.90 cm  SHUNTS Systemic VTI:  0.12 m Systemic Diam: 2.30 cm Kristeen Miss MD Electronically signed by Kristeen Miss MD Signature Date/Time: 08/29/2019/3:06:57 PM    Final     Subjective: Seen and examined at bedside and patient was very agitated and wanting to leave.  He ripped out  his IV and was very frustrated.  No chest pain, lightheadedness or dizziness.  No other concerns or complaints at this time  Discharge Exam: Vitals:   08/30/19 1015 08/30/19 1020  BP: (!) 142/87 126/78  Pulse: 72 70  Resp: 16 16  Temp:    SpO2:     Vitals:   08/30/19 1006 08/30/19 1009 08/30/19 1015 08/30/19 1020  BP: 114/82  (!) 142/87 126/78  Pulse: 76 74 72 70  Resp: 16 16 16 16   Temp:      TempSrc:      SpO2:      Weight:      Height:       General: Pt is alert, awake, not in acute distress Cardiovascular: RRR, S1/S2 +, no rubs, no gallops Respiratory: Diminished bilaterally, no wheezing, no rhonchi Abdominal: Soft, NT, ND, bowel sounds + Extremities: no edema, no cyanosis  The results of significant diagnostics from this hospitalization (including imaging, microbiology, ancillary and laboratory) are listed below for reference.    Microbiology: No results found for this or any previous visit (from the past 240 hour(s)).   Labs: BNP (last 3 results) No results for input(s): BNP in the last 8760 hours. Basic Metabolic Panel: Recent Labs  Lab 08/29/19 0809 08/30/19 0311  NA 139 139  K 4.2 4.2  CL 102 103  CO2 26 25  GLUCOSE 174* 114*  BUN 15 22*  CREATININE 1.38* 1.36*  CALCIUM 9.3 9.1   Liver Function Tests: Recent Labs  Lab 08/29/19 0809  AST 41  ALT 34  ALKPHOS 56  BILITOT 0.8  PROT 6.2*  ALBUMIN 3.6   No results for input(s): LIPASE, AMYLASE in the last 168 hours. No results for input(s): AMMONIA in the last 168 hours. CBC: Recent Labs  Lab 08/29/19 0809 08/30/19 0311  WBC 4.7 9.4  NEUTROABS 3.9  --   HGB 13.8 12.8*  HCT 40.7 37.7*  MCV 92.3 92.0  PLT 245 223   Cardiac Enzymes: No results for input(s): CKTOTAL, CKMB, CKMBINDEX, TROPONINI in the last 168 hours. BNP: Invalid input(s): POCBNP CBG: No results for input(s): GLUCAP in the last 168 hours. D-Dimer No results for input(s): DDIMER in the last 72 hours. Hgb A1c Recent Labs     08/29/19 0809  HGBA1C 5.2   Lipid Profile Recent Labs    08/29/19 0809  CHOL 161  HDL 48  LDLCALC 96  TRIG 86  CHOLHDL 3.4   Thyroid function studies Recent Labs    08/29/19 0809  TSH 0.726   Anemia work up No results for input(s): VITAMINB12, FOLATE, FERRITIN, TIBC, IRON, RETICCTPCT in the last  72 hours. Urinalysis No results found for: COLORURINE, APPEARANCEUR, LABSPEC, PHURINE, GLUCOSEU, HGBUR, BILIRUBINUR, KETONESUR, PROTEINUR, UROBILINOGEN, NITRITE, LEUKOCYTESUR Sepsis Labs Invalid input(s): PROCALCITONIN,  WBC,  LACTICIDVEN Microbiology No results found for this or any previous visit (from the past 240 hour(s)).  Time coordinating discharge: 35 minutes  SIGNED:  Merlene Laughter, DO [facility] 08/30/2019, 7:53 PM Pager is on AMION  If 7PM-7AM, please contact night-coverage [email or web address] Password [email or web address]

## 2019-08-30 NOTE — Progress Notes (Addendum)
Progress Note  Patient Name: Reginald Nguyen Date of Encounter: 08/30/2019  Primary Cardiologist: Diamond Nickel, MD   Subjective   He reports feeling better today. No complaints of chest pain or SOB. No palpitations or LE edema. He is eager to go home today as his wife fell yesterday and was on the floor for 6 hours. He is also concerned because he doesn't have a ride home.   Inpatient Medications    Scheduled Meds: . aspirin EC  81 mg Oral Daily  . atorvastatin  40 mg Oral Daily  . busPIRone  15 mg Oral TID  . clonazePAM  2 mg Oral Daily  . doxepin  100 mg Oral QHS  . FLUoxetine  20 mg Oral Daily  . fluticasone furoate-vilanterol  1 puff Inhalation Daily  . gabapentin  300 mg Oral BID  . lamoTRIgine  200 mg Oral Daily  . lamoTRIgine  25 mg Oral Daily  . metoprolol tartrate  25 mg Oral BID  . montelukast  10 mg Oral QHS  . pantoprazole  40 mg Oral BID  . regadenoson      . sodium chloride flush  3 mL Intravenous Q12H   Continuous Infusions: . sodium chloride     PRN Meds: sodium chloride, acetaminophen, albuterol, nitroGLYCERIN, ondansetron (ZOFRAN) IV, sodium chloride flush   Vital Signs    Vitals:   08/30/19 1006 08/30/19 1009 08/30/19 1015 08/30/19 1020  BP: 114/82  (!) 142/87 126/78  Pulse: 76 74 72 70  Resp: 16 16 16 16   Temp:      TempSrc:      SpO2:      Weight:      Height:        Intake/Output Summary (Last 24 hours) at 08/30/2019 1230 Last data filed at 08/29/2019 2012 Gross per 24 hour  Intake 250 ml  Output --  Net 250 ml   Filed Weights   08/29/19 08/31/19  Weight: 107.2 kg    Telemetry    Sinus rhythm with intermittent 1st degree AV block - Personally Reviewed  Physical Exam   GEN: Sitting on the edge of his hospital bed in no acute distress.   Neck: No JVD, no carotid bruits Cardiac: RRR, no murmurs, rubs, or gallops.  Respiratory: Clear to auscultation bilaterally, no wheezes/ rales/ rhonchi GI: NABS, Soft, obese, nontender,  non-distended  MS: No edema; No deformity. Neuro:  Nonfocal, moving all extremities spontaneously Psych: Normal affect   Labs    Chemistry Recent Labs  Lab 08/29/19 0809 08/30/19 0311  NA 139 139  K 4.2 4.2  CL 102 103  CO2 26 25  GLUCOSE 174* 114*  BUN 15 22*  CREATININE 1.38* 1.36*  CALCIUM 9.3 9.1  PROT 6.2*  --   ALBUMIN 3.6  --   AST 41  --   ALT 34  --   ALKPHOS 56  --   BILITOT 0.8  --   GFRNONAA 56* 57*  GFRAA >60 >60  ANIONGAP 11 11     Hematology Recent Labs  Lab 08/29/19 0809 08/30/19 0311  WBC 4.7 9.4  RBC 4.41 4.10*  HGB 13.8 12.8*  HCT 40.7 37.7*  MCV 92.3 92.0  MCH 31.3 31.2  MCHC 33.9 34.0  RDW 13.2 13.2  PLT 245 223    Cardiac EnzymesNo results for input(s): TROPONINI in the last 168 hours. No results for input(s): TROPIPOC in the last 168 hours.   BNPNo results for input(s): BNP, PROBNP in  the last 168 hours.   DDimer No results for input(s): DDIMER in the last 168 hours.   Radiology    DG Chest 2 View  Result Date: 08/29/2019 CLINICAL DATA:  59 year old male with chest pain. EXAM: CHEST - 2 VIEW COMPARISON:  Chest radiograph dated 08/28/2019. FINDINGS: There is no focal consolidation, pleural effusion, or pneumothorax. Background of emphysema. The cardiac silhouette is within normal limits. No acute osseous pathology. IMPRESSION: No active cardiopulmonary disease. Electronically Signed   By: Anner Crete M.D.   On: 08/29/2019 15:32   ECHOCARDIOGRAM COMPLETE  Result Date: 08/29/2019    ECHOCARDIOGRAM REPORT   Patient Name:   Reginald Nguyen Date of Exam: 08/29/2019 Medical Rec #:  [id number]    Height:       71.0 in Accession #:    [id number]   Weight:       236.4 lb Date of Birth:  [birth year]     BSA:          2.264 m Patient Age:    59 years     BP:           99/67 mmHg Patient Gender: M            HR:           92 bpm. Exam Location:  Inpatient Procedure: 2D Echo Indications:    786.50 chest pain  History:        Patient has no prior  history of Echocardiogram examinations.                 CAD; Risk Factors:Dyslipidemia and Current Smoker. Heart attack.  Sonographer:    Jannett Celestine RDCS (AE) Referring Phys: [id number] Donato Heinz  Sonographer Comments: see comments. challenging apical windows IMPRESSIONS  1. Left ventricular ejection fraction, by estimation, is 60 to 65%. The left ventricle has normal function. The left ventricle has no regional wall motion abnormalities. Left ventricular diastolic parameters are consistent with Grade I diastolic dysfunction (impaired relaxation).  2. Right ventricular systolic function is normal. The right ventricular size is normal.  3. The mitral valve is normal in structure and function. No evidence of mitral valve regurgitation. No evidence of mitral stenosis.  4. The aortic valve is normal in structure and function. Aortic valve regurgitation is not visualized. No aortic stenosis is present.  5. Aortic dilatation noted. 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 FINDINGS  Left Ventricle: Left ventricular ejection fraction, by estimation, is 60 to 65%. The left ventricle has normal function. The left ventricle has no regional wall motion abnormalities. The left ventricular internal cavity size was normal in size. There is  no left ventricular hypertrophy. Left ventricular diastolic parameters are consistent with Grade I diastolic dysfunction (impaired relaxation). Right Ventricle: The right ventricular size is normal. No increase in right ventricular wall thickness. Right ventricular systolic function is normal. Left Atrium: Left atrial size was normal in size. Right Atrium: Right atrial size was normal in size. Pericardium: There is no evidence of pericardial effusion. Mitral Valve: The mitral valve is normal in structure and function. No evidence of mitral valve regurgitation. No evidence of mitral valve stenosis. Tricuspid Valve: The tricuspid valve is grossly normal.  Tricuspid valve regurgitation is not demonstrated. No evidence of tricuspid stenosis. Aortic Valve: The aortic valve is normal in structure and function. Aortic valve regurgitation is not visualized. No aortic stenosis is present. Pulmonic Valve: The pulmonic valve was normal in structure. Pulmonic  valve regurgitation is not visualized. Aorta: Aortic dilatation noted. 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 IAS/Shunts: The atrial septum is grossly normal.  LEFT VENTRICLE PLAX 2D LVIDd:         4.41 cm LVIDs:         3.05 cm LV PW:         1.18 cm LV IVS:        1.13 cm LVOT diam:     2.30 cm LV SV:         51 LV SV Index:   22 LVOT Area:     4.15 cm  LEFT ATRIUM         Index LA diam:    3.20 cm 1.41 cm/m  AORTIC VALVE LVOT Vmax:   71.10 cm/s LVOT Vmean:  51.800 cm/s LVOT VTI:    0.122 m  AORTA Ao Root diam: 3.90 cm  SHUNTS Systemic VTI:  0.12 m Systemic Diam: 2.30 cm Kristeen Miss MD Electronically signed by Kristeen Miss MD Signature Date/Time: 08/29/2019/3:06:57 PM    Final     Cardiac Studies   Echocardiogram 08/29/2019: 1. Left ventricular ejection fraction, by estimation, is 60 to 65%. The  left ventricle has normal function. The left ventricle has no regional  wall motion abnormalities. Left ventricular diastolic parameters are  consistent with Grade I diastolic  dysfunction (impaired relaxation).  2. Right ventricular systolic function is normal. The right ventricular  size is normal.  3. The mitral valve is normal in structure and function. No evidence of  mitral valve regurgitation. No evidence of mitral stenosis.  4. The aortic valve is normal in structure and function. Aortic valve  regurgitation is not visualized. No aortic stenosis is present.  5. Aortic dilatation noted. There is moderate dilatation of the ascending  aorta measuring 45 mm.   Patient Profile     59 y.o. 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  Assessment & Plan    1. Chest pain in patient with non-obstructive CAD on cath in 2007: patient presented with intermittent progressive chest pain. Trops negative and EKG non-ischemic. Echo showed EF 60-65%, G1DD, and no RWMA. Decision made to avoid invasive testing and coronary CTA due to elevated Cr (unclear acuity) and recent contrast. He went for NST this morning.  - Will follow-up stress test results. If low risk study, do not anticipate further inpatient work-up. - Continue aspirin, statin, and BBlocker  2.  HLD: LDL 96 this admission. Atorvastatin increased to 40mg  daily - Continue atorvastatin   3. Aortic dilatation: Ascending aorta measured 45 mm on TTE.  Suspect inaccurate measurement, as CTPA report from [facility] showed normal aortic caliber  Case management consult placed to assist with transportation needs.   Patient was encouraged to call [facility] office to ensure close follow-up after discharge.    For questions or updates, please contact [facility] Please consult [email or web address] for contact info under Cardiology/STEMI.      Signed, Perry Mount, PA-C  08/30/2019, 12:30 PM   [phone]  Patient seen and examined.  Agree with above documentation.  On exam, patient is alert and oriented, regular rate and rhythm, no murmurs, lungs CTAB, no LE edema or JVD.  Telemetry personally reviewed, shows sinus rhythm in 70s.  Reports that he is feeling improved today.  Denies any  chest pain or shortness of breath.  TTE yesterday showed EF 60 to 65%, ascending aorta measured 45 mm (suspect inaccurate measurement, CTPA report from [facility] showed normal caliber aorta).    Lexiscan Myoview is pending, but patient states that he cannot wait for the results of the study as he has to get home to see his wife who had a fall yesterday.  We will call him with the results of the study.  We  will plan medical management unless high risk.  If high risk stress test, will coordinate cardiac catheterization as an outpatient.  Little Ishikawa, MD

## 2019-08-30 NOTE — Care Management CC44 (Signed)
Condition Code 44 Documentation Completed  Patient Details  Name: Reginald Nguyen MRN: [id number] Date of Birth: 12-13-1960   Condition Code 44 given:  Yes Patient signature on Condition Code 44 notice:  Yes Documentation of 2 MD's agreement:  Yes Code 44 added to claim:  Yes    Darrold Span, RN 08/30/2019, 4:15 PM

## 2019-08-31 NOTE — Telephone Encounter (Signed)
Call attempt made to patient - LM Per chart review, patient sees Dorena Dew MD for cardiology care.

## 2019-09-01 NOTE — Telephone Encounter (Signed)
I have tried calling patient as well, but unable to reach

## 2019-09-12 ENCOUNTER — Ambulatory Visit: Payer: Medicare Other | Admitting: Allergy

## 2019-09-12 ENCOUNTER — Ambulatory Visit: Payer: Self-pay | Admitting: Allergy

## 2020-02-19 DIAGNOSIS — E559 Vitamin D deficiency, unspecified: Secondary | ICD-10-CM | POA: Insufficient documentation

## 2020-02-19 HISTORY — DX: Vitamin D deficiency, unspecified: E55.9

## 2020-06-27 ENCOUNTER — Other Ambulatory Visit: Payer: Self-pay

## 2020-06-27 DIAGNOSIS — R42 Dizziness and giddiness: Secondary | ICD-10-CM | POA: Insufficient documentation

## 2020-06-27 DIAGNOSIS — J45909 Unspecified asthma, uncomplicated: Secondary | ICD-10-CM | POA: Insufficient documentation

## 2020-06-27 DIAGNOSIS — F319 Bipolar disorder, unspecified: Secondary | ICD-10-CM | POA: Insufficient documentation

## 2020-06-27 DIAGNOSIS — I219 Acute myocardial infarction, unspecified: Secondary | ICD-10-CM | POA: Insufficient documentation

## 2020-06-27 DIAGNOSIS — F4310 Post-traumatic stress disorder, unspecified: Secondary | ICD-10-CM | POA: Insufficient documentation

## 2020-06-27 DIAGNOSIS — K2270 Barrett's esophagus without dysplasia: Secondary | ICD-10-CM | POA: Insufficient documentation

## 2020-06-27 DIAGNOSIS — G935 Compression of brain: Secondary | ICD-10-CM | POA: Insufficient documentation

## 2020-06-27 DIAGNOSIS — K219 Gastro-esophageal reflux disease without esophagitis: Secondary | ICD-10-CM | POA: Insufficient documentation

## 2020-06-27 DIAGNOSIS — G43909 Migraine, unspecified, not intractable, without status migrainosus: Secondary | ICD-10-CM | POA: Insufficient documentation

## 2020-06-27 DIAGNOSIS — Z8679 Personal history of other diseases of the circulatory system: Secondary | ICD-10-CM | POA: Insufficient documentation

## 2020-07-08 ENCOUNTER — Ambulatory Visit: Payer: Medicare Other | Admitting: Cardiology

## 2020-07-29 ENCOUNTER — Ambulatory Visit: Payer: 59 | Admitting: Cardiology

## 2020-09-27 ENCOUNTER — Ambulatory Visit: Payer: Medicare Other | Admitting: Cardiology

## 2020-10-15 DIAGNOSIS — I10 Essential (primary) hypertension: Secondary | ICD-10-CM

## 2020-10-15 DIAGNOSIS — E6609 Other obesity due to excess calories: Secondary | ICD-10-CM

## 2020-10-15 HISTORY — DX: Other obesity due to excess calories: E66.09

## 2020-10-15 HISTORY — DX: Essential (primary) hypertension: I10

## 2020-10-24 ENCOUNTER — Other Ambulatory Visit: Payer: Self-pay

## 2020-10-24 DIAGNOSIS — I2510 Atherosclerotic heart disease of native coronary artery without angina pectoris: Secondary | ICD-10-CM | POA: Insufficient documentation

## 2020-10-28 ENCOUNTER — Ambulatory Visit: Payer: Medicare Other | Admitting: Cardiology

## 2020-11-11 DIAGNOSIS — K219 Gastro-esophageal reflux disease without esophagitis: Secondary | ICD-10-CM | POA: Insufficient documentation

## 2020-11-14 ENCOUNTER — Ambulatory Visit (INDEPENDENT_AMBULATORY_CARE_PROVIDER_SITE_OTHER): Payer: Medicare Other | Admitting: Cardiology

## 2020-11-14 ENCOUNTER — Ambulatory Visit (INDEPENDENT_AMBULATORY_CARE_PROVIDER_SITE_OTHER): Payer: Medicare Other

## 2020-11-14 ENCOUNTER — Other Ambulatory Visit: Payer: Self-pay

## 2020-11-14 VITALS — BP 102/60 | HR 63 | Ht 71.5 in | Wt 227.0 lb

## 2020-11-14 DIAGNOSIS — R002 Palpitations: Secondary | ICD-10-CM

## 2020-11-14 DIAGNOSIS — J449 Chronic obstructive pulmonary disease, unspecified: Secondary | ICD-10-CM

## 2020-11-14 DIAGNOSIS — R55 Syncope and collapse: Secondary | ICD-10-CM

## 2020-11-14 DIAGNOSIS — I2510 Atherosclerotic heart disease of native coronary artery without angina pectoris: Secondary | ICD-10-CM

## 2020-11-14 DIAGNOSIS — E785 Hyperlipidemia, unspecified: Secondary | ICD-10-CM | POA: Diagnosis not present

## 2020-11-14 DIAGNOSIS — R42 Dizziness and giddiness: Secondary | ICD-10-CM

## 2020-11-14 HISTORY — DX: Syncope and collapse: R55

## 2020-11-14 LAB — LIPID PANEL
Chol/HDL Ratio: 5 ratio (ref 0.0–5.0)
Cholesterol, Total: 185 mg/dL (ref 100–199)
HDL: 37 mg/dL — ABNORMAL LOW (ref >39)
LDL Chol Calc (NIH): 87 mg/dL (ref 0–99)
Triglycerides: 374 mg/dL — ABNORMAL HIGH (ref 0–149)
VLDL Cholesterol Cal: 61 mg/dL — ABNORMAL HIGH (ref 5–40)

## 2020-11-14 NOTE — Progress Notes (Signed)
Cardiology Consultation:    Date:  11/14/2020   ID:  Reginald Nguyen, DOB Apr 07, 1961, MRN [id number]  PCP:  Yisroel Ramming, MD  Cardiologist:  Gypsy Balsam, MD   Referring MD: Yisroel Ramming, MD   Chief Complaint  Patient presents with  . Dizziness    Started 06/2020    History of Present Illness:    Reginald Nguyen is a 60 y.o. male who is being seen today for the evaluation of dizziness and syncope at the request of Yisroel Ramming, MD.  I had a pleasure to take a few many years ago he carry diagnosis of multiple risk factors for coronary artery disease however so far evaluation did not show any critical lesions.  He did have cardiac catheterization 2007 with nonobstructive disease and then another cardiac catheterization 2021 which showed nonobstructive disease.  He still continues to smoke quite heavily as well as does have dyslipidemia.  He wanted to be seen again by me because of episode of dizziness.  He said dizziness can happen anytime he could be sitting could be lying down can be walking developed dizziness in the medevac he described 1 episode of syncope which happened sometimes at the end of last year he said he was walking to the kitchen next and he knew he was found on the floor.  There was no chest pain palpitation tightness squeezing pressure burning chest before it. He lost his wife a few months ago and still grieving after days.  He denies having any typical chest pain tightness squeezing pressure burning chest.  Shortness of breath still there.  Sadly he still continues to smoke.  Past Medical History:  Diagnosis Date  . Acid reflux   . Asthma   . Barrett's esophagus   . Bipolar 1 disorder (HCC) 08/29/2019  . Bipolar disorder (HCC)   . CAD (coronary artery disease)   . CAD in native artery 02/21/2015   Formatting of this note might be different from the original. nonobstructive disease by cath 2007  . Chest discomfort 03/30/2018  . Chiari I malformation (HCC)   .  Chronic obstructive pulmonary disease (HCC) 08/29/2019  . Coronary artery disease involving native coronary artery of native heart without angina pectoris 02/21/2015  . Dyslipidemia   . GERD (gastroesophageal reflux disease)   . Heart attack (HCC)    x2  . History of angina   . Malaise and fatigue 08/25/2017  . Manic depression (HCC)   . Migraine   . PTSD (post-traumatic stress disorder)    since teenager   . Smoking 02/21/2015  . Unstable angina (HCC) 08/29/2019  . Vertigo     Past Surgical History:  Procedure Laterality Date  . EYE SURGERY Bilateral   . HEMORRHOID SURGERY      Current Medications: Current Meds  Medication Sig  . ASPIRIN 81 PO Take 162 mg by mouth in the morning and at bedtime.   Marland Kitchen atorvastatin (LIPITOR) 40 MG tablet Take 1 tablet (40 mg total) by mouth daily.  . busPIRone (BUSPAR) 15 MG tablet Take 15 mg by mouth 3 (three) times daily.   . clonazePAM (KLONOPIN) 2 MG tablet Take 2 mg by mouth at bedtime.   . Cyanocobalamin (VITAMIN B12 PO) Take 1 tablet by mouth daily. Unknown strength  . docusate sodium (COLACE) 100 MG capsule Take 100 mg by mouth daily.  Marland Kitchen doxepin (SINEQUAN) 50 MG capsule Take 100 mg by mouth at bedtime.   . fenofibrate (TRICOR) 145 MG tablet Take  145 mg by mouth daily.  Marland Kitchen FLUoxetine (PROZAC) 20 MG capsule Take 20 mg by mouth daily.  Marland Kitchen gabapentin (NEURONTIN) 300 MG capsule Take 300 mg by mouth 3 (three) times daily.  Marland Kitchen KRILL OIL PO Take 1 capsule by mouth daily. Unknown strength  . lamoTRIgine (LAMICTAL) 25 MG tablet Take 25 mg by mouth daily.  Marland Kitchen levocetirizine (XYZAL) 5 MG tablet Take 5 mg by mouth daily.  . meloxicam (MOBIC) 7.5 MG tablet Take 7.5 mg by mouth in the morning and at bedtime.  . metoprolol tartrate (LOPRESSOR) 25 MG tablet Take 1 tablet (25 mg total) by mouth 2 (two) times daily.  . montelukast (SINGULAIR) 10 MG tablet Take 10 mg by mouth in the morning and at bedtime.   . nitroGLYCERIN (NITROSTAT) 0.4 MG SL tablet Place 0.4 mg  under the tongue every 5 (five) minutes as needed for chest pain.  . pantoprazole (PROTONIX) 40 MG tablet Take 40 mg by mouth 2 (two) times daily.   Marland Kitchen triamcinolone cream (KENALOG) 0.1 % Apply 1 application topically 2 (two) times daily. For 1-2 weeks  . VITAMIN D, ERGOCALCIFEROL, PO Take 1 tablet by mouth daily. Unknown strength  . VITAMIN E PO Take 1 capsule by mouth in the morning and at bedtime. Unknown strength  . Wheat Dextrin (BENEFIBER PO) Take 1 tablet by mouth daily. Unknown strenght  . [DISCONTINUED] clopidogrel (PLAVIX) 75 MG tablet Take 1 tablet by mouth daily.  . [DISCONTINUED] lamoTRIgine (LAMICTAL) 200 MG tablet Take 25 mg by mouth daily.  . [DISCONTINUED] Vitamin D, Ergocalciferol, (DRISDOL) 1.25 MG (50000 UNIT) CAPS capsule Take 1 capsule by mouth once a week.     Allergies:   Morphine, Codeine, Other, and Sulfa antibiotics   Social History   Socioeconomic History  . Marital status: Married    Spouse name: Not on file  . Number of children: 1  . Years of education: Not on file  . Highest education level: GED or equivalent  Occupational History  . Occupation: disabled  Tobacco Use  . Smoking status: Current Every Day Smoker    Packs/day: 1.50    Years: 44.00    Pack years: 66.00    Types: Cigarettes    Start date: 16  . Smokeless tobacco: Former Neurosurgeon    Types: Snuff  . Tobacco comment: used to smoke cigars & pipes  Vaping Use  . Vaping Use: Never used  Substance and Sexual Activity  . 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 Drug use: Not Currently  . Sexual activity: Not on file  Other Topics Concern  . Not on file  Social History Narrative   Lives in an apt with his wife   Right handed   Caffeine: coffee 2 cups daily, root beer occasional    Social Determinants of Health   Financial Resource Strain: Not on file  Food Insecurity: Not on file  Transportation Needs: Not on file  Physical Activity: Not on file  Stress: Not  on file  Social Connections: Not on file     Family History: The patient's family history includes Cancer in his maternal grandfather, maternal grandmother, paternal grandfather, paternal grandmother, and sister; Heart Problems in his father; Hypertension in his father; Migraines in his maternal aunt; Other in his father and maternal aunt; Stroke in his father. ROS:   Please see the history of present illness.    All 14 point review of systems negative except as described  per history of present illness.  EKGs/Labs/Other Studies Reviewed:    The following studies were reviewed today: I did review record from hospitalization from February he was there because of noncardiac issues.  EKG:  EKG is  ordered today.  The ekg ordered today demonstrates sinus rhythm with first-degree AV block, left axis deviation, nonspecific ST segment changes.  Recent Labs: No results found for requested labs within last 8760 hours.  Recent Lipid Panel    Component Value Date/Time   CHOL 161 08/29/2019 0809   TRIG 86 08/29/2019 0809   HDL 48 08/29/2019 0809   CHOLHDL 3.4 08/29/2019 0809   VLDL 17 08/29/2019 0809   LDLCALC 96 08/29/2019 0809    Physical Exam:    VS:  BP 102/60 (BP Location: Right Arm, Patient Position: Sitting)   Pulse 63   Ht 5' 11.5" (1.816 m)   Wt 227 lb (103 kg)   SpO2 94%   BMI 31.22 kg/m     Wt Readings from Last 3 Encounters:  11/14/20 227 lb (103 kg)  08/29/19 236 lb 6.4 oz (107.2 kg)  04/11/19 238 lb 3.2 oz (108 kg)     GEN:  Well nourished, well developed in no acute distress HEENT: Normal NECK: No JVD; No carotid bruits LYMPHATICS: No lymphadenopathy CARDIAC: RRR, no murmurs, no rubs, no gallops RESPIRATORY:  Clear to auscultation without rales, wheezing or rhonchi  ABDOMEN: Soft, non-tender, non-distended MUSCULOSKELETAL:  No edema; No deformity  SKIN: Warm and dry NEUROLOGIC:  Alert and oriented x 3 PSYCHIATRIC:  Normal affect   ASSESSMENT:    1.  Coronary artery disease involving native coronary artery of native heart without angina pectoris   2. Chronic obstructive pulmonary disease, unspecified COPD type (HCC)   3. Dyslipidemia   4. Dizziness   5. Syncope and collapse    PLAN:    In order of problems listed above:  1. Coronary disease but only luminal based on cardiac catheterization from March 2021 done at [facility].  He of course is risk factors modifications.  He is taking very aspirin which I advised him to continue, of course we spent a great of time talking about quitting smoking which he understand and hopefully he will be able to accomplish but honestly I doubt. 2. Dizziness and syncope.  I will schedule him to have Zio patch for 1 week to see if he got any significant arrhythmia.  As a part of evaluation echocardiogram will be done to assess his left ventricle ejection fraction.  I did review record from the hospital which showed carotic ultrasounds done in February showing up to 50% blockages both sides but none of this was critical. 3. Dyslipidemia, on he is on Lipitor and Tricor which I will continue.  I do have his last fasting lipid profile from [facility] this is August 29, 2019 with LDL of 96 and HDL 48.  We will repeat his fasting lipid profile. 4. Smoking obviously huge problem we spent at least 10 minutes talking about his significant all different pains and helps I can give him to help quitting smoking I think he smokes a lot I can see his fingers being yellow because of smoking he actually put his cigarettes on the desk in the examination room.   Medication Adjustments/Labs and Tests Ordered: Current medicines are reviewed at length with the patient today.  Concerns regarding medicines are outlined above.  No orders of the defined types were placed in this encounter.  No orders of the defined types were placed in this encounter.   Signed, Georgeanna Lea, MD, [facility]. 11/14/2020 10:46 AM    [facility]

## 2020-11-14 NOTE — Addendum Note (Signed)
Addended by: Hazle Quant on: 11/14/2020 10:56 AM   Modules accepted: Orders

## 2020-11-14 NOTE — Patient Instructions (Signed)
Medication Instructions:  Your physician recommends that you continue on your current medications as directed. Please refer to the Current Medication list given to you today.  *If you need a refill on your cardiac medications before your next appointment, please call your pharmacy*   Lab Work: Your physician recommends that you return for lab work today: lipid  If you have labs (blood work) drawn today and your tests are completely normal, you will receive your results only by: Marland Kitchen MyChart Message (if you have MyChart) OR . A paper copy in the mail If you have any lab test that is abnormal or we need to change your treatment, we will call you to review the results.   Testing/Procedures: Your physician has requested that you have an echocardiogram. Echocardiography is a painless test that uses sound waves to create images of your heart. It provides your doctor with information about the size and shape of your heart and how well your heart's chambers and valves are working. This procedure takes approximately one hour. There are no restrictions for this procedure.  A zio monitor was ordered today. It will remain on for 7 days. You will then return monitor and event diary in provided box. It takes 1-2 weeks for report to be downloaded and returned to [place]. We will call you with the results. If monitor falls off or has orange flashing light, please call Zio for further instructions.      Follow-Up: At [facility], you and your health needs are our priority.  As part of our continuing mission to provide you with exceptional heart care, we have created designated Provider Care Teams.  These Care Teams include your primary Cardiologist (physician) and Advanced Practice Providers (APPs -  Physician Assistants and Nurse Practitioners) who all work together to provide you with the care you need, when you need it.  We recommend signing up for the patient portal called "MyChart".  Sign up information is  provided on this After Visit Summary.  MyChart is used to connect with patients for Virtual Visits (Telemedicine).  Patients are able to view lab/test results, encounter notes, upcoming appointments, etc.  Non-urgent messages can be sent to your provider as well.   To learn more about what you can do with MyChart, go to [email or web address].    Your next appointment:   3 month(s)  The format for your next appointment:   In Person  Provider:   Gypsy Balsam, MD   Other Instructions   Echocardiogram An echocardiogram is a test that uses sound waves (ultrasound) to produce images of the heart. Images from an echocardiogram can provide important information about:  Heart size and shape.  The size and thickness and movement of your heart's walls.  Heart muscle function and strength.  Heart valve function or if you have stenosis. Stenosis is when the heart valves are too narrow.  If blood is flowing backward through the heart valves (regurgitation).  A tumor or infectious growth around the heart valves.  Areas of heart muscle that are not working well because of poor blood flow or injury from a heart attack.  Aneurysm detection. An aneurysm is a weak or damaged part of an artery wall. The wall bulges out from the normal force of blood pumping through the body. Tell a health care provider about:  Any allergies you have.  All medicines you are taking, including vitamins, herbs, eye drops, creams, and over-the-counter medicines.  Any blood disorders you have.  Any surgeries you  have had.  Any medical conditions you have.  Whether you are pregnant or may be pregnant. What are the risks? Generally, this is a safe test. However, problems may occur, including an allergic reaction to dye (contrast) that may be used during the test. What happens before the test? No specific preparation is needed. You may eat and drink normally. What happens during the test?  You will take  off your clothes from the waist up and put on a hospital gown.  Electrodes or electrocardiogram (ECG)patches may be placed on your chest. The electrodes or patches are then connected to a device that monitors your heart rate and rhythm.  You will lie down on a table for an ultrasound exam. A gel will be applied to your chest to help sound waves pass through your skin.  A handheld device, called a transducer, will be pressed against your chest and moved over your heart. The transducer produces sound waves that travel to your heart and bounce back (or "echo" back) to the transducer. These sound waves will be captured in real-time and changed into images of your heart that can be viewed on a video monitor. The images will be recorded on a computer and reviewed by your health care provider.  You may be asked to change positions or hold your breath for a short time. This makes it easier to get different views or better views of your heart.  In some cases, you may receive contrast through an IV in one of your veins. This can improve the quality of the pictures from your heart. The procedure may vary among health care providers and hospitals.   What can I expect after the test? You may return to your normal, everyday life, including diet, activities, and medicines, unless your health care provider tells you not to do that. Follow these instructions at home:  It is up to you to get the results of your test. Ask your health care provider, or the department that is doing the test, when your results will be ready.  Keep all follow-up visits. This is important. Summary  An echocardiogram is a test that uses sound waves (ultrasound) to produce images of the heart.  Images from an echocardiogram can provide important information about the size and shape of your heart, heart muscle function, heart valve function, and other possible heart problems.  You do not need to do anything to prepare before this  test. You may eat and drink normally.  After the echocardiogram is completed, you may return to your normal, everyday life, unless your health care provider tells you not to do that. This information is not intended to replace advice given to you by your health care provider. Make sure you discuss any questions you have with your health care provider. Document Revised: 02/13/2020 Document Reviewed: 02/13/2020 Elsevier Patient Education  2021 [employer].

## 2020-11-18 ENCOUNTER — Telehealth: Payer: Self-pay | Admitting: Cardiology

## 2020-11-18 NOTE — Telephone Encounter (Signed)
Patient informed of lab results. 

## 2020-11-18 NOTE — Telephone Encounter (Signed)
Tried to call patient back held for 5 minutes will call patient back.

## 2020-11-18 NOTE — Telephone Encounter (Signed)
PT is returning a about results

## 2020-11-18 NOTE — Telephone Encounter (Signed)
Pt is returning a call to [facility] from earlier today. Please advise

## 2020-11-20 DIAGNOSIS — R42 Dizziness and giddiness: Secondary | ICD-10-CM | POA: Diagnosis not present

## 2020-11-20 DIAGNOSIS — R55 Syncope and collapse: Secondary | ICD-10-CM

## 2020-11-20 DIAGNOSIS — I2510 Atherosclerotic heart disease of native coronary artery without angina pectoris: Secondary | ICD-10-CM | POA: Diagnosis not present

## 2020-11-20 DIAGNOSIS — R002 Palpitations: Secondary | ICD-10-CM

## 2020-11-20 DIAGNOSIS — J449 Chronic obstructive pulmonary disease, unspecified: Secondary | ICD-10-CM

## 2020-11-20 DIAGNOSIS — E785 Hyperlipidemia, unspecified: Secondary | ICD-10-CM

## 2020-12-10 ENCOUNTER — Other Ambulatory Visit: Payer: Self-pay

## 2020-12-10 ENCOUNTER — Ambulatory Visit (INDEPENDENT_AMBULATORY_CARE_PROVIDER_SITE_OTHER): Payer: Medicare Other

## 2020-12-10 DIAGNOSIS — J449 Chronic obstructive pulmonary disease, unspecified: Secondary | ICD-10-CM

## 2020-12-10 DIAGNOSIS — I2510 Atherosclerotic heart disease of native coronary artery without angina pectoris: Secondary | ICD-10-CM | POA: Diagnosis not present

## 2020-12-10 DIAGNOSIS — R42 Dizziness and giddiness: Secondary | ICD-10-CM

## 2020-12-10 DIAGNOSIS — E785 Hyperlipidemia, unspecified: Secondary | ICD-10-CM | POA: Diagnosis not present

## 2020-12-10 DIAGNOSIS — R002 Palpitations: Secondary | ICD-10-CM

## 2020-12-10 DIAGNOSIS — R55 Syncope and collapse: Secondary | ICD-10-CM

## 2020-12-10 LAB — ECHOCARDIOGRAM COMPLETE
Area-P 1/2: 3.45 cm2
S' Lateral: 3.6 cm

## 2020-12-10 NOTE — Progress Notes (Signed)
Complete echocardiogram performed.  Jimmy Tritia Endo RDCS, RVT  

## 2021-01-09 ENCOUNTER — Other Ambulatory Visit: Payer: Self-pay

## 2021-01-09 ENCOUNTER — Ambulatory Visit (INDEPENDENT_AMBULATORY_CARE_PROVIDER_SITE_OTHER): Payer: Medicare Other | Admitting: Podiatry

## 2021-01-09 ENCOUNTER — Encounter: Payer: Self-pay | Admitting: Podiatry

## 2021-01-09 DIAGNOSIS — M79676 Pain in unspecified toe(s): Secondary | ICD-10-CM

## 2021-01-09 DIAGNOSIS — B351 Tinea unguium: Secondary | ICD-10-CM | POA: Diagnosis not present

## 2021-01-09 DIAGNOSIS — M6740 Ganglion, unspecified site: Secondary | ICD-10-CM

## 2021-01-09 DIAGNOSIS — M79609 Pain in unspecified limb: Secondary | ICD-10-CM

## 2021-01-09 NOTE — Progress Notes (Signed)
  Subjective:  Patient ID: Reginald Nguyen, male    DOB: [birth year],  MRN: [id number]  Chief Complaint  Patient presents with   Nail Problem    I have some toenails that are ingrown and discolored and some thickness to them as well and I am not a diabetic    60 y.o. male presents with the above complaint. History confirmed with patient. Also complains of spot at the right 2nd toe without pain.  Objective:  Physical Exam: warm, good capillary refill, no trophic changes or ulcerative lesions, normal DP and PT pulses, and normal sensory exam. Left Foot: normal exam, no swelling, tenderness, instability; ligaments intact, full range of motion of all ankle/foot joints  Right Foot: Right 2nd toe DIPJ digital mucoid cyst. Assessment:   1. Mucoid cyst of joint   2. Pain due to onychomycosis of nail    Plan:  Patient was evaluated and treated and all questions answered.  Onychomycosis -Does not meet criteria for at risk foot care. -Nails palliatively debrided secondary to pain  Procedure: Nail Debridement Type of Debridement: manual, sharp debridement. Instrumentation: Nail nipper, rotary burr. Number of Nails: 10  Digital mucoid Cyst Right -Educated on etiology -Defer treatment as it is not painful.  No follow-ups on file.

## 2021-03-12 ENCOUNTER — Telehealth: Payer: Self-pay | Admitting: Cardiology

## 2021-03-12 MED ORDER — NITROGLYCERIN 0.4 MG SL SUBL: 0.4000 mg | SUBLINGUAL_TABLET | SUBLINGUAL | 3 refills | Status: DC | PRN

## 2021-03-12 NOTE — Telephone Encounter (Signed)
*  STAT* If patient is at the pharmacy, call can be transferred to refill team.   1. Which medications need to be refilled? (please list name of each medication and dose if known) new prescription for Nitroglycerin  2. Which pharmacy/location (including street and city if local pharmacy) is medication to be sent to? [facility] [employer], [place]   3. Do they need a 30 day or 90 day supply?

## 2021-03-12 NOTE — Telephone Encounter (Signed)
Refill sent in per request.  

## 2021-03-25 ENCOUNTER — Ambulatory Visit: Payer: Medicare Other | Admitting: Cardiology

## 2021-05-13 ENCOUNTER — Ambulatory Visit (INDEPENDENT_AMBULATORY_CARE_PROVIDER_SITE_OTHER): Payer: Medicare Other | Admitting: Allergy

## 2021-05-13 ENCOUNTER — Other Ambulatory Visit: Payer: Self-pay

## 2021-05-13 ENCOUNTER — Encounter: Payer: Self-pay | Admitting: Allergy

## 2021-05-13 VITALS — BP 118/80 | HR 88 | Resp 16 | Ht 70.7 in | Wt 230.8 lb

## 2021-05-13 DIAGNOSIS — J310 Chronic rhinitis: Secondary | ICD-10-CM

## 2021-05-13 DIAGNOSIS — R0982 Postnasal drip: Secondary | ICD-10-CM

## 2021-05-13 DIAGNOSIS — J4540 Moderate persistent asthma, uncomplicated: Secondary | ICD-10-CM | POA: Diagnosis not present

## 2021-05-13 DIAGNOSIS — Z91018 Allergy to other foods: Secondary | ICD-10-CM

## 2021-05-13 MED ORDER — ALBUTEROL SULFATE HFA 108 (90 BASE) MCG/ACT IN AERS: 2.0000 | INHALATION_SPRAY | Freq: Four times a day (QID) | RESPIRATORY_TRACT | 1 refills | Status: DC | PRN

## 2021-05-13 MED ORDER — LEVOCETIRIZINE DIHYDROCHLORIDE 5 MG PO TABS: 5.0000 mg | ORAL_TABLET | Freq: Every day | ORAL | 11 refills | Status: DC | PRN

## 2021-05-13 MED ORDER — AZELASTINE HCL 0.1 % NA SOLN: NASAL | 5 refills | Status: DC

## 2021-05-13 MED ORDER — MONTELUKAST SODIUM 10 MG PO TABS: 10.0000 mg | ORAL_TABLET | Freq: Every day | ORAL | 5 refills | Status: DC

## 2021-05-13 NOTE — Progress Notes (Signed)
Follow-up Note  RE: Reginald Nguyen MRN: [id number] DOB: 1960-08-10 Date of Office Visit: 05/13/2021   History of present illness: Reginald Nguyen is a 60 y.o. male presenting today for follow-up of rhinitis with postnasal drip, asthma and food allergy.  He was last seen in the office on 04/11/2019 by myself.  Since this visit he states he did have a hospitalization/prolonged ED stay however he does not recall what it was for or when in the 2-year.  It occurred.  Per EMR review it appears he does have a diagnosis of coronary artery disease, Barrett's esophagus as well as a Chiari malformation but he is being followed for since his last visit. 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  He denies any surgeries since his last visit. He unfortunately states his wife passed away last year and this has been taking a toll on him.  In regards to his allergies he states he does have occasional runny nose as well as sneezing.  He has been taking Allegra but states this does not work.  Thus he tried taking Benadryl which he also states did not work.  He feels like the Xyzal after trying these other antihistamines worked the best that he would like to go back to that option.  He also would like refill of his Astelin as he states that was helpful for his runny nose when he had it to use.  He also was on Singulair but he does not know at this point in time if he is still taking Singulair. He does not note more shortness of breath especially on warmer weather days.  He lost his albuterol thus he does not have anything to use for this symptom.  He used to take Virgel Bouquet but states this was causing thrush thus he stopped use.  At this time he is quite hesitant to use any other asthma like medicine at this time due to potential thrush concerns. He does continue to avoid mushrooms.  He has no interest in eating mushrooms.    Review of systems: Review of Systems  Constitutional: Negative.   HENT:         See  HPI  Eyes: Negative.   Respiratory:         See HPI  Cardiovascular: Negative.   Gastrointestinal: Negative.   Musculoskeletal: Negative.   Skin: Negative.   Neurological: Negative.    All other systems negative unless noted above in HPI  Past medical/social/surgical/family history have been reviewed and are unchanged unless specifically indicated below.  See HPI  Medication List: Current Outpatient Medications  Medication Sig Dispense Refill   albuterol (VENTOLIN HFA) 108 (90 Base) MCG/ACT inhaler Inhale 2 puffs into the lungs every 6 (six) hours as needed for wheezing or shortness of breath. 18 g 1   amitriptyline (ELAVIL) 25 MG tablet Take by mouth.     ASPIRIN 81 PO Take 162 mg by mouth in the morning and at bedtime.      atorvastatin (LIPITOR) 40 MG tablet Take 1 tablet (40 mg total) by mouth daily. 30 tablet 0   azelastine (ASTELIN) 0.1 % nasal spray 2 sprays in each nostril 1-2 times per day 30 mL 5   busPIRone (BUSPAR) 30 MG tablet Take 30 mg by mouth 2 (two) times daily.     clonazePAM (KLONOPIN) 2 MG tablet Take 2 mg by mouth at bedtime.      Cyanocobalamin (VITAMIN B12 PO) Take 1 tablet by  mouth daily. Unknown strength     docusate sodium (COLACE) 100 MG capsule Take 100 mg by mouth daily.     doxepin (SINEQUAN) 50 MG capsule Take 100 mg by mouth at bedtime.      fenofibrate (TRICOR) 145 MG tablet Take 145 mg by mouth daily.     FLUoxetine (PROZAC) 20 MG capsule Take 20 mg by mouth daily.     gabapentin (NEURONTIN) 300 MG capsule Take 300 mg by mouth 3 (three) times daily.     Guselkumab (TREMFYA) 100 MG/ML SOPN      KRILL OIL PO Take 1 capsule by mouth daily. Unknown strength     lamoTRIgine (LAMICTAL) 200 MG tablet Take 200 mg by mouth every morning.     levocetirizine (XYZAL) 5 MG tablet Take 1 tablet (5 mg total) by mouth daily as needed. 30 tablet 11   Meclizine HCl 25 MG CHEW Chew by mouth.     metoprolol tartrate (LOPRESSOR) 25 MG tablet Take 1 tablet (25 mg  total) by mouth 2 (two) times daily. 60 tablet 0   nitroGLYCERIN (NITROSTAT) 0.4 MG SL tablet Place 1 tablet (0.4 mg total) under the tongue every 5 (five) minutes as needed for chest pain. 30 tablet 3   pantoprazole (PROTONIX) 40 MG tablet Take 40 mg by mouth 2 (two) times daily.      triamcinolone cream (KENALOG) 0.1 % Apply 1 application topically 2 (two) times daily. For 1-2 weeks     VITAMIN D, ERGOCALCIFEROL, PO Take 1 tablet by mouth daily. Unknown strength     VITAMIN E PO Take 1 capsule by mouth in the morning and at bedtime. Unknown strength     Wheat Dextrin (BENEFIBER PO) Take 1 tablet by mouth daily. Unknown strenght     montelukast (SINGULAIR) 10 MG tablet Take 1 tablet (10 mg total) by mouth daily in the afternoon. 30 tablet 5   No current facility-administered medications for this visit.     Known medication allergies: Allergies  Allergen Reactions   Morphine Other (See Comments) and Anaphylaxis    ask   Codeine Other (See Comments)    ask HIVES   Mushroom Extract Complex    Other     Lemons, Limes, Mushrooms  Has to have allergy shots   Sulfa Antibiotics Other (See Comments) and Diarrhea    IBS GI Upset     Physical examination: Blood pressure 118/80, pulse 88, resp. rate 16, height 5' 10.7" (1.796 m), weight 230 lb 12.8 oz (104.7 kg), SpO2 98 %.  General: Alert, interactive, in no acute distress. HEENT: PERRLA, TMs pearly gray, turbinates minimally edematous without discharge, post-pharynx non erythematous, edentulous. Neck: Supple without lymphadenopathy. Lungs: Clear to auscultation without wheezing, rhonchi or rales. {no increased work of breathing. CV: Normal S1, S2 without murmurs. Abdomen: Nondistended, nontender. Skin: Warm and dry, without lesions or rashes. Extremities:  No clubbing, cyanosis or edema. Neuro:   Grossly intact.  Diagnositics/Labs: Spirometry: FEV1: 2.66 L 73%, FVC: 3.66 L 77%, ratio consistent with nonobstructive  pattern  Assessment and plan: Rhinitis with significant postnasal drip     - environmental allergy panel via blood work was negative in 2018.   Recommend a skin testing visit off all antihistamines to see if you are allergic to any environmental allergens     - resume Xyzal 5mg  daily as needed for allergy symptom control     - resume Singulair 10mg  daily      - resume nasal Astelin 2  sprays each nostril 1-2 times a day for nasal drainage control.  This spray does not taste good if gets in back of throat thus would recommend rinses out mouth after use.       - use nasal saline spray to help keep nose moisturized      Asthma, moderate persistent     - singulair as above     - have access to albuterol inhaler 2 puffs every 4-6 hours as needed for cough/wheeze/shortness of breath/chest tightness.  May use 15-20 minutes prior to activity.   Monitor frequency of use.       - lung function testing looks ok today  Food allergy     - continue avoidance of mushroom at this time   Follow-up 4-6 months  I appreciate the opportunity to take part in [facility]'s care. Please do not hesitate to contact me with questions.  Sincerely,   Margo Aye, MD Allergy/Immunology [facility]

## 2021-05-13 NOTE — Patient Instructions (Signed)
Allergies     - environmental allergy panel via blood work was negative in 2018.   Recommend a skin testing visit off all antihistamines to see if you are allergic to any environmental allergens     - resume Xyzal 5mg  daily as needed for allergy symptom control     - resume Singulair 10mg  daily      - resume nasal Astelin 2 sprays each nostril 1-2 times a day for nasal drainage control.  This spray does not taste good if gets in back of throat thus would recommend rinses out mouth after use.       - use nasal saline spray to help keep nose moisturized      Asthma     - singulair as above     - have access to albuterol inhaler 2 puffs every 4-6 hours as needed for cough/wheeze/shortness of breath/chest tightness.  May use 15-20 minutes prior to activity.   Monitor frequency of use.       - lung function testing looks ok today  Food allergy     - continue avoidance of mushroom at this time   Follow-up 4-6 months

## 2021-07-10 ENCOUNTER — Ambulatory Visit: Payer: Medicare Other | Admitting: Cardiology

## 2021-10-14 ENCOUNTER — Ambulatory Visit (INDEPENDENT_AMBULATORY_CARE_PROVIDER_SITE_OTHER): Payer: Medicare Other | Admitting: Allergy

## 2021-10-14 ENCOUNTER — Encounter: Payer: Self-pay | Admitting: Allergy

## 2021-10-14 VITALS — BP 110/70 | HR 80 | Resp 16

## 2021-10-14 DIAGNOSIS — J310 Chronic rhinitis: Secondary | ICD-10-CM

## 2021-10-14 DIAGNOSIS — R0982 Postnasal drip: Secondary | ICD-10-CM | POA: Diagnosis not present

## 2021-10-14 DIAGNOSIS — J4540 Moderate persistent asthma, uncomplicated: Secondary | ICD-10-CM

## 2021-10-14 DIAGNOSIS — Z91018 Allergy to other foods: Secondary | ICD-10-CM

## 2021-10-14 MED ORDER — IPRATROPIUM BROMIDE 0.06 % NA SOLN: NASAL | 5 refills | Status: DC

## 2021-10-14 NOTE — Progress Notes (Signed)
? ? ?Follow-up Note ? ?RE: Reginald Nguyen MRN: [id number] DOB: 1960/10/14 ?Date of Office Visit: 10/14/2021 ? ? ?History of present illness: ?Reginald Nguyen is a 61 y.o. male presenting today for follow-up of rhinitis with PND, asthma and mushroom allergy.  He was last seen in the office on 05/13/2021 by myself.   ?He states he is still having issues with his drainage.  He states he couldn't take the Astelin due to the bad taste.  He is taking the xyzal which helps buts state it is not strong enough.  He does not feel the singulair has helped at all with his drainage but does help with this asthma symptoms thus he does take this daily.  In regards to his asthma he reports he may need to use his albuterol if he is doing symptoms with physical activity especially outdoors at this time of the year.  Thus he may use his albuterol couple times a week on average if he is physically active. ?He continues to avoid mushrooms. ?He is still an active smoker and does not have any intentions on quitting.  He states he started smoking at age 41 and he is now 42.  He has his pack of cigarettes in his shirt pocket. ? ?Review of systems: ?Review of Systems  ?Constitutional: Negative.   ?HENT:  Positive for postnasal drip.   ?Eyes: Negative.   ?Respiratory:  Positive for cough, shortness of breath and wheezing.   ?Cardiovascular: Negative.   ?Musculoskeletal: Negative.   ?Skin: Negative.   ?Allergic/Immunologic: Negative.   ?Neurological: Negative.    ? ?All other systems negative unless noted above in HPI ? ?Past medical/social/surgical/family history have been reviewed and are unchanged unless specifically indicated below. ? ?No changes ? ?Medication List: ?Current Outpatient Medications  ?Medication Sig Dispense Refill  ? albuterol (VENTOLIN HFA) 108 (90 Base) MCG/ACT inhaler Inhale 2 puffs into the lungs every 6 (six) hours as needed for wheezing or shortness of breath. 18 g 1  ? ASPIRIN 81 PO Take 162 mg by mouth in the morning and at  bedtime.     ? atorvastatin (LIPITOR) 40 MG tablet Take 1 tablet (40 mg total) by mouth daily. 30 tablet 0  ? azelastine (ASTELIN) 0.1 % nasal spray 2 sprays in each nostril 1-2 times per day 30 mL 5  ? busPIRone (BUSPAR) 30 MG tablet Take 30 mg by mouth 2 (two) times daily.    ? clonazePAM (KLONOPIN) 2 MG tablet Take 2 mg by mouth at bedtime.     ? Cyanocobalamin (VITAMIN B12 PO) Take 1 tablet by mouth daily. Unknown strength    ? docusate sodium (COLACE) 100 MG capsule Take 100 mg by mouth daily.    ? doxepin (SINEQUAN) 50 MG capsule Take 100 mg by mouth at bedtime.     ? DULoxetine (CYMBALTA) 60 MG capsule Take 60 mg by mouth at bedtime.    ? fenofibrate (TRICOR) 145 MG tablet Take 145 mg by mouth daily.    ? gabapentin (NEURONTIN) 300 MG capsule Take 300 mg by mouth 3 (three) times daily.    ? Guselkumab (TREMFYA) 100 MG/ML SOPN     ? ipratropium (ATROVENT) 0.06 % nasal spray 2 sprays each nostril up to 3-4 times a day as needed 15 mL 5  ? KRILL OIL PO Take 1 capsule by mouth daily. Unknown strength    ? lamoTRIgine (LAMICTAL) 25 MG tablet Take 50 mg by mouth daily.    ? levocetirizine (  XYZAL) 5 MG tablet Take 1 tablet (5 mg total) by mouth daily as needed. 30 tablet 11  ? Meclizine HCl 25 MG CHEW Chew by mouth.    ? meloxicam (MOBIC) 7.5 MG tablet Take 7.5 mg by mouth 2 (two) times daily.    ? metoprolol tartrate (LOPRESSOR) 25 MG tablet Take 1 tablet (25 mg total) by mouth 2 (two) times daily. 60 tablet 0  ? montelukast (SINGULAIR) 10 MG tablet Take 1 tablet (10 mg total) by mouth daily in the afternoon. 30 tablet 5  ? nitroGLYCERIN (NITROSTAT) 0.4 MG SL tablet Place 1 tablet (0.4 mg total) under the tongue every 5 (five) minutes as needed for chest pain. 30 tablet 3  ? pantoprazole (PROTONIX) 40 MG tablet Take 40 mg by mouth 2 (two) times daily.     ? rOPINIRole (REQUIP) 0.25 MG tablet Take 0.25 mg by mouth at bedtime.    ? triamcinolone cream (KENALOG) 0.1 % Apply 1 application topically 2 (two) times daily.  For 1-2 weeks    ? VITAMIN D, ERGOCALCIFEROL, PO Take 1 tablet by mouth daily. Unknown strength    ? VITAMIN E PO Take 1 capsule by mouth in the morning and at bedtime. Unknown strength    ? Wheat Dextrin (BENEFIBER PO) Take 1 tablet by mouth daily. Unknown strenght    ? ?No current facility-administered medications for this visit.  ?  ? ?Known medication allergies: ?Allergies  ?Allergen Reactions  ? Morphine Other (See Comments) and Anaphylaxis  ?  ask  ? Codeine Other (See Comments)  ?  ask ?HIVES  ? Mushroom Extract Complex   ? Other   ?  Lemons, Limes, Mushrooms  ?Has to have allergy shots  ? Sulfa Antibiotics Other (See Comments) and Diarrhea  ?  IBS ?GI Upset  ? ? ? ?Physical examination: ?Blood pressure 110/70, pulse 80, resp. rate 16, SpO2 96 %. ? ?General: Alert, interactive, in no acute distress. ?HEENT: PERRLA, TMs pearly gray, turbinates minimally edematous with clear discharge, post-pharynx non erythematous. ?Neck: Supple without lymphadenopathy. ?Lungs: Clear to auscultation without wheezing, rhonchi or rales. {no increased work of breathing. ?CV: Normal S1, S2 without murmurs. ?Abdomen: Nondistended, nontender. ?Skin: Warm and dry, without lesions or rashes. ?Extremities:  No clubbing, cyanosis or edema. ?Neuro:   Grossly intact. ? ?Diagnositics/Labs: ?None today ? ?Assessment and plan: ?  ?Allergic rhinitis ?Postnasal drip ?    - environmental allergy panel via blood work was negative in 2018.   Recommend a skin testing visit off all antihistamines for 3 days prior to this visit to see if you are allergic to any environmental allergens ?    - continue Xyzal 5mg  daily (can take an additional 1/2 to 1 whole ppill for total 2 tabs a day if needed) ?    - continue Singulair 10mg  daily  ?    - start nasal Atrovent 2 sprays each nostril up to 3-4 times a day as needed for nasal drainage control.  This spray should not have a taste and thus should be able to tolerate this better than previous nasal spray.   ?    - use nasal saline spray to help keep nose moisturized ?     ?Asthma ?    - singulair as above ?    - have access to albuterol inhaler 2 puffs every 4-6 hours as needed for cough/wheeze/shortness of breath/chest tightness.  May use 15-20 minutes prior to activity.   Monitor frequency of use.   ? ?  Food allergy ?    - continue avoidance of mushroom at this time  ? ?Follow-up for skin testing and for routine visit in 4-6 months ? ?I appreciate the opportunity to take part in [facility]'s care. Please do not hesitate to contact me with questions. ? ?Sincerely, ? ? ?Margo Aye, MD ?Allergy/Immunology ?[facility] ? ? ?

## 2021-10-14 NOTE — Patient Instructions (Addendum)
Allergies ?    - environmental allergy panel via blood work was negative in 2018.   Recommend a skin testing visit off all antihistamines for 3 days prior to this visit to see if you are allergic to any environmental allergens ?    - continue Xyzal 5mg  daily (can take an additional 1/2 to 1 whole ppill for total 2 tabs a day if needed) ?    - continue Singulair 10mg  daily  ?    - start nasal Atrovent 2 sprays each nostril up to 3-4 times a day as needed for nasal drainage control.  This spray should not have a taste and thus should be able to tolerate this better than previous nasal spray.  ?    - use nasal saline spray to help keep nose moisturized ?     ?Asthma ?    - singulair as above ?    - have access to albuterol inhaler 2 puffs every 4-6 hours as needed for cough/wheeze/shortness of breath/chest tightness.  May use 15-20 minutes prior to activity.   Monitor frequency of use.   ? ?Food allergy ?    - continue avoidance of mushroom at this time  ? ?Follow-up for skin testing and for routine visit in 4-6 months ?

## 2021-10-24 ENCOUNTER — Ambulatory Visit (INDEPENDENT_AMBULATORY_CARE_PROVIDER_SITE_OTHER): Payer: Medicare Other | Admitting: Cardiology

## 2021-10-24 ENCOUNTER — Encounter: Payer: Self-pay | Admitting: Cardiology

## 2021-10-24 VITALS — BP 92/70 | HR 82 | Ht 71.0 in | Wt 219.4 lb

## 2021-10-24 DIAGNOSIS — J449 Chronic obstructive pulmonary disease, unspecified: Secondary | ICD-10-CM | POA: Diagnosis not present

## 2021-10-24 DIAGNOSIS — E785 Hyperlipidemia, unspecified: Secondary | ICD-10-CM

## 2021-10-24 DIAGNOSIS — R55 Syncope and collapse: Secondary | ICD-10-CM

## 2021-10-24 DIAGNOSIS — I2510 Atherosclerotic heart disease of native coronary artery without angina pectoris: Secondary | ICD-10-CM | POA: Diagnosis not present

## 2021-10-24 DIAGNOSIS — F17200 Nicotine dependence, unspecified, uncomplicated: Secondary | ICD-10-CM | POA: Diagnosis not present

## 2021-10-24 DIAGNOSIS — F319 Bipolar disorder, unspecified: Secondary | ICD-10-CM

## 2021-10-24 DIAGNOSIS — IMO0001 Reserved for inherently not codable concepts without codable children: Secondary | ICD-10-CM

## 2021-10-24 NOTE — Progress Notes (Signed)
?Cardiology Office Note:   ? ?Date:  10/24/2021  ? ?ID:  Reginald Nguyen, DOB 1961-04-20, MRN [id number] ? ?PCP:  Yisroel Ramming, MD  ?Cardiologist:  Gypsy Balsam, MD   ? ?Referring MD: Yisroel Ramming, MD  ? ?Chief Complaint  ?Patient presents with  ? Dizziness  ? ? ?History of Present Illness:   ? ?Reginald Nguyen is a 61 y.o. male with past medical history significant for coronary artery disease.  In 2007 he did have cardiac catheterization done which showed nonobstructive disease and then in 2021 he had another cardiac catheterization showing nonobstructive disease.  He does have history of bipolar disorder, he is a chronic smoker, GERD, dyslipidemia.  He presented to [place] because of episode of dizziness dizziness happen when he turns his head very quickly also happen when he gets up very quickly.  We did work-up for potential arrhythmia he wore monitor for 2 weeks shows some short episodes of supraventricular tachycardia, echocardiogram has been performed which showed normal left ventricle ejection fraction without significant pathology.  He is coming today to my office to talk about it.  He said dizziness still happening and happen anytime he turns he said also sometimes when he gets up very quickly.  I also noted his blood pressure being a little below today.  We had a long discussion about what to do with the situation I encouraged him to drink plenty of fluid also encouraged him to limit his salt intake. ? ?Past Medical History:  ?Diagnosis Date  ? Acid reflux   ? Asthma   ? Barrett's esophagus   ? Bipolar 1 disorder (HCC) 08/29/2019  ? Bipolar disorder (HCC)   ? CAD (coronary artery disease)   ? CAD in native artery 02/21/2015  ? Formatting of this note might be different from the original. nonobstructive disease by cath 2007  ? Chest discomfort 03/30/2018  ? Chiari I malformation (HCC)   ? Chronic obstructive pulmonary disease (HCC) 08/29/2019  ? Coronary artery disease involving native coronary artery of native  heart without angina pectoris 02/21/2015  ? Dyslipidemia   ? GERD (gastroesophageal reflux disease)   ? Heart attack [facility])   ? x2  ? History of angina   ? Malaise and fatigue 08/25/2017  ? Manic depression (HCC)   ? Migraine   ? PTSD (post-traumatic stress disorder)   ? since teenager   ? Smoking 02/21/2015  ? Unstable angina (HCC) 08/29/2019  ? Vertigo   ? ? ?Past Surgical History:  ?Procedure Laterality Date  ? EYE SURGERY Bilateral   ? HEMORRHOID SURGERY    ? ? ?Current Medications: ?Current Meds  ?Medication Sig  ? albuterol (VENTOLIN HFA) 108 (90 Base) MCG/ACT inhaler Inhale 2 puffs into the lungs every 6 (six) hours as needed for wheezing or shortness of breath.  ? ASPIRIN 81 PO Take 162 mg by mouth in the morning and at bedtime.   ? atorvastatin (LIPITOR) 40 MG tablet Take 1 tablet (40 mg total) by mouth daily.  ? busPIRone (BUSPAR) 30 MG tablet Take 30 mg by mouth 2 (two) times daily.  ? Cyanocobalamin (VITAMIN B12 PO) Take 1 tablet by mouth daily. Unknown strength  ? DULoxetine (CYMBALTA) 60 MG capsule Take 60 mg by mouth at bedtime.  ? fenofibrate (TRICOR) 145 MG tablet Take 145 mg by mouth daily.  ? FLUoxetine (PROZAC) 20 MG tablet Take 20 mg by mouth daily.  ? KRILL OIL PO Take 1 capsule by mouth daily. Unknown strength  ?  lamoTRIgine (LAMICTAL) 200 MG tablet Take 200 mg by mouth in the morning.  ? lamoTRIgine (LAMICTAL) 25 MG tablet Take 25 mg by mouth every evening.  ? levocetirizine (XYZAL) 5 MG tablet Take 1 tablet (5 mg total) by mouth daily as needed. (Patient taking differently: Take 5 mg by mouth daily as needed for allergies.)  ? meloxicam (MOBIC) 7.5 MG tablet Take 7.5 mg by mouth 2 (two) times daily.  ? metoprolol tartrate (LOPRESSOR) 25 MG tablet Take 1 tablet (25 mg total) by mouth 2 (two) times daily.  ? montelukast (SINGULAIR) 10 MG tablet Take 1 tablet (10 mg total) by mouth daily in the afternoon.  ? nitroGLYCERIN (NITROSTAT) 0.4 MG SL tablet Place 1 tablet (0.4 mg total) under the tongue  every 5 (five) minutes as needed for chest pain.  ? pantoprazole (PROTONIX) 40 MG tablet Take 40 mg by mouth 2 (two) times daily.   ? VITAMIN D, ERGOCALCIFEROL, PO Take 1 tablet by mouth daily. Unknown strength  ? VITAMIN E PO Take 1 capsule by mouth in the morning and at bedtime. Unknown strength  ? Wheat Dextrin (BENEFIBER PO) Take 1 tablet by mouth daily. Unknown strenght  ?  ? ?Allergies:   Morphine, Codeine, Mushroom extract complex, Other, and Sulfa antibiotics  ? ?Social History  ? ?Socioeconomic History  ? Marital status: Married  ?  Spouse name: Not on file  ? Number of children: 1  ? Years of education: Not on file  ? Highest education level: GED or equivalent  ?Occupational History  ? Occupation: disabled  ?Tobacco Use  ? Smoking status: Every Day  ?  Packs/day: 1.50  ?  Years: 44.00  ?  Pack years: 66.00  ?  Types: Cigarettes  ?  Start date: 7  ? Smokeless tobacco: Former  ?  Types: Snuff  ? Tobacco comments:  ?  used to smoke cigars & pipes  ?Vaping Use  ? Vaping Use: Never used  ?Substance and Sexual Activity  ? 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 Drug use: Not Currently  ? Sexual activity: Not on file  ?Other Topics Concern  ? Not on file  ?Social History Narrative  ? Lives in an apt with his wife  ? Right handed  ? Caffeine: coffee 2 cups daily, root beer occasional   ? ?Social Determinants of Health  ? ?Financial Resource Strain: Not on file  ?Food Insecurity: Not on file  ?Transportation Needs: Not on file  ?Physical Activity: Not on file  ?Stress: Not on file  ?Social Connections: Not on file  ?  ? ?Family History: ?The patient's family history includes Cancer in his maternal grandfather, maternal grandmother, paternal grandfather, paternal grandmother, and sister; Heart Problems in his father; Hypertension in his father; Migraines in his maternal aunt; Other in his father and maternal aunt; Stroke in his father. ?ROS:   ?Please see the history of present illness.     ?All 14 point review of systems negative except as described per history of present illness ? ?EKGs/Labs/Other Studies Reviewed:   ? ? ? ?Recent Labs: ?No results found for requested labs within last 8760 hours.  ?Recent Lipid Panel ?   ?Component Value Date/Time  ? CHOL 185 11/14/2020 1103  ? TRIG 374 (H) 11/14/2020 1103  ? HDL 37 (L) 11/14/2020 1103  ? CHOLHDL 5.0 11/14/2020 1103  ? CHOLHDL 3.4 08/29/2019 0809  ? VLDL 17 08/29/2019 0809  ? LDLCALC 87  11/14/2020 1103  ? ? ?Physical Exam:   ? ?VS:  BP 92/70 (BP Location: Right Arm, Patient Position: Sitting)   Pulse 82   Ht 5\' 11"  (1.803 m)   Wt 219 lb 6.4 oz (99.5 kg)   SpO2 96%   BMI 30.60 kg/m?    ? ?Wt Readings from Last 3 Encounters:  ?10/24/21 219 lb 6.4 oz (99.5 kg)  ?05/13/21 230 lb 12.8 oz (104.7 kg)  ?11/14/20 227 lb (103 kg)  ?  ? ?GEN:  Well nourished, well developed in no acute distress ?HEENT: Normal ?NECK: No JVD; No carotid bruits ?LYMPHATICS: No lymphadenopathy ?CARDIAC: RRR, no murmurs, no rubs, no gallops ?RESPIRATORY:  Clear to auscultation without rales, wheezing or rhonchi  ?ABDOMEN: Soft, non-tender, non-distended ?MUSCULOSKELETAL:  No edema; No deformity  ?SKIN: Warm and dry ?LOWER EXTREMITIES: no swelling ?NEUROLOGIC:  Alert and oriented x 3 ?PSYCHIATRIC:  Normal affect  ? ?ASSESSMENT:   ? ?1. Coronary artery disease involving native coronary artery of native heart without angina pectoris   ?2. Chronic obstructive pulmonary disease, unspecified COPD type (HCC)   ?3. Smoking   ?4. Dyslipidemia   ?5. Bipolar 1 disorder (HCC)   ?6. Syncope and collapse   ? ?PLAN:   ? ?In order of problems listed above: ? ?Coronary artery disease stable from that point review ?Denies have any chest pain tightness squeezing pressure burning chest we will continue risk factors modification obesity mostly below 1 will be quitting smoking.  I will make sure that he is on antiplatelet therapy. ?2.  Chronic obstructive pulmonary disease obviously related to  smoking spent at least 10 minutes talking about need to quit he understand to hopefully be able to accomplish that. ?3.  Dizziness.  So far no cardiac explanation for it.  I am concerned about low blood pressure I

## 2021-10-24 NOTE — Patient Instructions (Signed)
Medication Instructions:  Your physician recommends that you continue on your current medications as directed. Please refer to the Current Medication list given to you today.  *If you need a refill on your cardiac medications before your next appointment, please call your pharmacy*   Lab Work: Lipid panel- today If you have labs (blood work) drawn today and your tests are completely normal, you will receive your results only by: MyChart Message (if you have MyChart) OR A paper copy in the mail If you have any lab test that is abnormal or we need to change your treatment, we will call you to review the results.   Testing/Procedures: None Ordered   Follow-Up: At [facility], you and your health needs are our priority.  As part of our continuing mission to provide you with exceptional heart care, we have created designated Provider Care Teams.  These Care Teams include your primary Cardiologist (physician) and Advanced Practice Providers (APPs -  Physician Assistants and Nurse Practitioners) who all work together to provide you with the care you need, when you need it.  We recommend signing up for the patient portal called "MyChart".  Sign up information is provided on this After Visit Summary.  MyChart is used to connect with patients for Virtual Visits (Telemedicine).  Patients are able to view lab/test results, encounter notes, upcoming appointments, etc.  Non-urgent messages can be sent to your provider as well.   To learn more about what you can do with MyChart, go to [email or web address].    Your next appointment:   6 month(s)  The format for your next appointment:   In Person  Provider:   Robert Krasowski, MD    Other Instructions NA  

## 2021-10-25 LAB — LIPID PANEL
Chol/HDL Ratio: 4.7 ratio (ref 0.0–5.0)
Cholesterol, Total: 185 mg/dL (ref 100–199)
HDL: 39 mg/dL — ABNORMAL LOW (ref >39)
LDL Chol Calc (NIH): 81 mg/dL (ref 0–99)
Triglycerides: 403 mg/dL — ABNORMAL HIGH (ref 0–149)
VLDL Cholesterol Cal: 65 mg/dL — ABNORMAL HIGH (ref 5–40)

## 2021-10-27 ENCOUNTER — Telehealth: Payer: Self-pay | Admitting: Cardiology

## 2021-10-27 NOTE — Telephone Encounter (Signed)
?*  STAT* If patient is at the pharmacy, call can be transferred to refill team. ? ? ?1. Which medications need to be refilled? (please list name of each medication and dose if known) nitroGLYCERIN (NITROSTAT) 0.4 MG SL tablet ? ?2. Which pharmacy/location (including street and city if local pharmacy) is medication to be sent to?  ?[facility] - [place], [place] - [address] ?3. Do they need a 30 day or 90 day supply? 30 day ? ?

## 2021-10-28 ENCOUNTER — Telehealth: Payer: Self-pay | Admitting: Cardiology

## 2021-10-28 ENCOUNTER — Other Ambulatory Visit: Payer: Self-pay

## 2021-10-28 MED ORDER — NITROGLYCERIN 0.4 MG SL SUBL: 0.4000 mg | SUBLINGUAL_TABLET | SUBLINGUAL | 3 refills | Status: DC | PRN

## 2021-10-28 NOTE — Telephone Encounter (Signed)
STAT if patient feels like he/she is going to faint  ? ?Are you dizzy now?  ?No  ? ?Do you feel faint or have you passed out?  ?No  ? ?Do you have any other symptoms?  ?Patient states he has been having frequent episodes where he feels like all his blood is rushing to his head and he is about to faint, but he hasn't fainted yet. He would like to know if Dr. Bing Matter thinks any of his medications need to be adjusted. ? ?Have you checked your HR and BP (record if available)?  ?4/24: 146/68 88 ?  ? ?

## 2021-10-28 NOTE — Telephone Encounter (Signed)
Spoke with pt advised pt to see or consult PCP a per Dr. Vanetta Shawl note. Pt agreed and stated he would call for an appt. ?

## 2021-10-31 ENCOUNTER — Telehealth: Payer: Self-pay

## 2021-10-31 NOTE — Telephone Encounter (Signed)
-----   Message from Georgeanna Lea, MD sent at 10/29/2021  9:51 AM EDT ----- ?Cholesterol is good but triglycerides are elevated.  Avoid sweets to be more active ?

## 2021-10-31 NOTE — Telephone Encounter (Signed)
Patient notified of results.

## 2021-11-12 ENCOUNTER — Other Ambulatory Visit: Payer: Self-pay | Admitting: Allergy

## 2021-12-16 ENCOUNTER — Ambulatory Visit: Payer: Medicare Other | Admitting: Allergy

## 2021-12-31 NOTE — Patient Instructions (Incomplete)
Allergies     - environmental allergy panel via blood work was negative in 2018.        - continue Xyzal 5mg  daily (can take an additional 1/2 to 1 whole pill for total 2 tabs a day if needed)     - continue Singulair 10mg  daily      - continuenasal Atrovent 2 sprays each nostril up to 3-4 times a day as needed for nasal drainage control.       - use nasal saline spray to help keep nose moisturized      Asthma     - singulair as above     - have access to albuterol inhaler 2 puffs every 4-6 hours as needed for cough/wheeze/shortness of breath/chest tightness.  May use 15-20 minutes prior to activity.   Monitor frequency of use.    Food allergy     - continue avoidance of mushroom at this time   Follow-up in months or sooner if needed

## 2022-01-01 ENCOUNTER — Encounter: Payer: Self-pay | Admitting: Family

## 2022-01-01 ENCOUNTER — Ambulatory Visit (INDEPENDENT_AMBULATORY_CARE_PROVIDER_SITE_OTHER): Payer: Medicare Other | Admitting: Family

## 2022-01-01 VITALS — BP 112/62 | HR 82 | Resp 16

## 2022-01-01 DIAGNOSIS — J310 Chronic rhinitis: Secondary | ICD-10-CM

## 2022-01-01 DIAGNOSIS — R0982 Postnasal drip: Secondary | ICD-10-CM

## 2022-01-01 DIAGNOSIS — Z91018 Allergy to other foods: Secondary | ICD-10-CM | POA: Diagnosis not present

## 2022-01-01 DIAGNOSIS — F17200 Nicotine dependence, unspecified, uncomplicated: Secondary | ICD-10-CM

## 2022-01-01 DIAGNOSIS — J4540 Moderate persistent asthma, uncomplicated: Secondary | ICD-10-CM | POA: Diagnosis not present

## 2022-01-01 MED ORDER — FLOVENT HFA 110 MCG/ACT IN AERO: 2.0000 | INHALATION_SPRAY | Freq: Two times a day (BID) | RESPIRATORY_TRACT | 5 refills | Status: DC

## 2022-01-01 MED ORDER — EPIPEN 2-PAK 0.3 MG/0.3ML IJ SOAJ: 0.3000 mg | INTRAMUSCULAR | 1 refills | Status: DC | PRN

## 2022-01-01 NOTE — Progress Notes (Signed)
[address] [place] [address] Dept: [phone]  FOLLOW UP NOTE  Patient ID: KATRON SCHERZER, male    DOB: [birth year]  Age: 61 y.o. MRN: [id number] Date of Office Visit: 01/01/2022  Assessment  Chief Complaint: No chief complaint on file.  HPI Reginald Nguyen is a 61 year old male who presents today for skin testing to environmental allergens.  He was last seen on October 14, 2021 by Dr. Delorse Lek for allergic rhinitis/postnasal drip, asthma, and food allergy.  Since his last office visit he denies any new diagnosis or surgeries.  Asthma: He reports a cough that can be productive at times and other times nonproductive.  When his cough is productive the sputum is clear in color.  He also reports nightly wheezing, tightness in his chest sometimes and shortness of breath.  He denies nocturnal awakenings due to breathing problems.  Since his last office visit he has not required any systemic steroids or made any trips to the emergency room or urgent care due to breathing problems.  He continues to smoke, but reports that he has decreased smoking.  He now smokes less than 1-1/2 packs/day.  Discussed the importance of stopping smoking.  Allergic rhinitis/postnasal drip: He reports watery eyes/clear rhinorrhea and nasal congestion in the morning.  He denies postnasal drip.  He has not had any sinus infections since we last saw him.  He takes Xyzal 5 mg twice a day and Singulair 10 mg once a day.  He is currently not using Atrovent nasal spray because he reports that it made him sick.  He thinks that it tastes bad.  He has to be careful about things that he puts in his nose or in his throat because he has a high gag reflex.  He cannot tolerate Astelin nasal spray due to the bad taste.  He continues to avoid mushroom without any accidental ingestion.  He reports anaphylactic reaction within 2 minutes after having a mushroom.  He reports that he has never had an epinephrine autoinjector device.   Drug  Allergies:  Allergies  Allergen Reactions   Morphine Other (See Comments) and Anaphylaxis    ask   Codeine Other (See Comments)    ask HIVES   Mushroom Extract Complex    Other     Lemons, Limes, Mushrooms  Has to have allergy shots   Sulfa Antibiotics Other (See Comments) and Diarrhea    IBS GI Upset    Review of Systems: Review of Systems  Constitutional:  Negative for fever.  HENT:         Reports clear rhinorrhea and nasal congestion.  Denies postnasal drip.  Eyes:        Reports itchy watery eyes.  He reports that his left eye is worse than his right eye.  He reports a history of left lazy eye  Respiratory:  Positive for cough, shortness of breath and wheezing.        Reports cough that can be productive and nonproductive at times.  When the cough is productive it is clear in color.  He also reports wheezing nightly, and tightness in his chest sometimes.  He denies nocturnal awakenings due to breathing problems  Cardiovascular:  Positive for chest pain. Negative for palpitations.       Reports chest pain at times.  He has nitroglycerin to take as needed.  He takes aspirin daily.  He reports that he has had 2 heart attacks and follows up with cardiology.  Gastrointestinal:  Denies heartburn or reflux symptoms  Genitourinary:  Negative for frequency.  Skin:  Positive for itching and rash.       Reports itchy rash on left upper forearm since stopping antihistamines.  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     Physical Exam: BP 112/62   Pulse 82   Resp 16   SpO2 97%    Physical Exam Constitutional:      Appearance: Normal appearance.  HENT:     Head: Normocephalic and atraumatic.     Comments: Pharynx normal, eyes normal, ears: Unable to see bilateral tympanic membrane due to cerumen.  Nose: Bilateral lower turbinates mildly edematous with no drainage noted    Right Ear: Tympanic membrane, ear canal and  external ear normal.     Left Ear: Tympanic membrane, ear canal and external ear normal.     Mouth/Throat:     Mouth: Mucous membranes are moist.     Pharynx: Oropharynx is clear.  Eyes:     Conjunctiva/sclera: Conjunctivae normal.  Cardiovascular:     Rate and Rhythm: Normal rate and regular rhythm.     Heart sounds: Normal heart sounds.  Pulmonary:     Effort: Pulmonary effort is normal.     Breath sounds: Normal breath sounds.     Comments: Lungs clear to auscultation Musculoskeletal:     Cervical back: Neck supple.  Skin:    General: Skin is warm.     Comments: Slightly erythematous papules noted in left upper arm  Neurological:     Mental Status: He is alert and oriented to person, place, and time.  Psychiatric:        Mood and Affect: Mood normal.        Behavior: Behavior normal.        Thought Content: Thought content normal.        Judgment: Judgment normal.     Diagnostics: FVC 2.75 L (58%), FEV1 2.30 L (64%).  Predicted FVC 4.71 L, predicted FEV1 3.61 L.  Spirometry indicates possible moderate restriction.  Postbronchodilator response shows FVC 3.51 L, FEV1 2.61 L.  There is a 13% change in FEV1.  Spirometry indicates possible mild restriction.  Assessment and Plan: 1. Not well controlled moderate persistent asthma   2. Food allergy   3. Other rhinitis   4. Postnasal drip   5. Current every day smoker     Meds ordered this encounter  Medications   EPIPEN 2-PAK 0.3 MG/0.3ML SOAJ injection    Sig: Inject 0.3 mg into the muscle as needed for anaphylaxis.    Dispense:  2 each    Refill:  1   FLOVENT HFA 110 MCG/ACT inhaler    Sig: Inhale 2 puffs into the lungs 2 (two) times daily. Use with spacer device.    Dispense:  1 each    Refill:  5    Patient Instructions  Allergies     - environmental allergy panel via blood work was negative in 2018.        - continue Xyzal 5mg  daily (can take an additional 1/2 to 1 whole pill for total 2 tabs a day if needed)      - continue Singulair 10mg  daily       - use nasal saline spray to help keep nose moisturized -We will attempt skin testing to environmental allergens at your next appointment.  You will need to be off all antihistamines 3 days prior  Asthma - Start Flovent 110 mcg 2 puffs twice a day with spacer to help prevent cough and wheeze.  Rinse mouth out afterwards to help prevent thrush (yeast).  Spacer given along with demonstration on how to use.     - singulair as above     - have access to albuterol inhaler 2 puffs every 4-6 hours as needed for cough/wheeze/shortness of breath/chest tightness.  May use 15-20 minutes prior to activity.   Monitor frequency of use.      -information given on smoking cessation  Food allergy     - continue avoidance of mushroom at this time      -emergency action plan given. Prescription for EpiPen 0.3 mg sent with demonstration  Follow-up in 4 to 6 weeks with skin testing to environmental allergens plus spirometry or sooner if needed Return in about 4 weeks (around 01/29/2022), or if symptoms worsen or fail to improve, for skin testing.    Thank you for the opportunity to care for this patient.  Please do not hesitate to contact me with questions.  Nehemiah Settle, FNP [facility]

## 2022-01-02 ENCOUNTER — Encounter: Payer: Self-pay | Admitting: Cardiology

## 2022-01-02 ENCOUNTER — Other Ambulatory Visit (INDEPENDENT_AMBULATORY_CARE_PROVIDER_SITE_OTHER): Payer: Medicare Other

## 2022-01-02 ENCOUNTER — Ambulatory Visit (INDEPENDENT_AMBULATORY_CARE_PROVIDER_SITE_OTHER): Payer: Medicare Other | Admitting: Cardiology

## 2022-01-02 DIAGNOSIS — R079 Chest pain, unspecified: Secondary | ICD-10-CM | POA: Diagnosis not present

## 2022-01-02 DIAGNOSIS — E785 Hyperlipidemia, unspecified: Secondary | ICD-10-CM

## 2022-01-02 DIAGNOSIS — F17200 Nicotine dependence, unspecified, uncomplicated: Secondary | ICD-10-CM

## 2022-01-02 DIAGNOSIS — I2510 Atherosclerotic heart disease of native coronary artery without angina pectoris: Secondary | ICD-10-CM

## 2022-01-02 DIAGNOSIS — K219 Gastro-esophageal reflux disease without esophagitis: Secondary | ICD-10-CM

## 2022-01-02 DIAGNOSIS — J449 Chronic obstructive pulmonary disease, unspecified: Secondary | ICD-10-CM

## 2022-01-02 LAB — TROPONIN T: Troponin T (Highly Sensitive): 10 ng/L (ref 0–22)

## 2022-01-02 MED ORDER — METOPROLOL TARTRATE 25 MG PO TABS: 12.5000 mg | ORAL_TABLET | Freq: Two times a day (BID) | ORAL | 3 refills | Status: DC

## 2022-01-02 NOTE — Progress Notes (Signed)
Cardiology Office Note:    Date:  01/02/2022   ID:  Reginald Nguyen, DOB May 17, 1961, MRN [id number]  PCP:  Yisroel Ramming, MD  Cardiologist:  Gypsy Balsam, MD    Referring MD: Yisroel Ramming, MD   No chief complaint on file. I have a chest pain  History of Present Illness:    Reginald Nguyen is a 61 y.o. male with past medical history significant for coronary artery disease.  In 2007 he had cardiac catheterization which showed nonobstructive disease and then in 2021 he had another cardiac catheterization showing nonobstructive disease he does have a history of bipolar disorder, she he is a chronic smoker and does have a history of GERD as well as dyslipidemia.  He walked into our office today complaining of having chest pain.  We want to refer him to the emergency room on evaluation however he declined.  He said he does not want to do that.  He described pain as uneasy sensation in the chest lasting for many hours actually for few days.  Sadly he still continues to smoke.  Past Medical History:  Diagnosis Date   Acid reflux    Asthma    Barrett's esophagus    Bipolar 1 disorder (HCC) 08/29/2019   Bipolar disorder (HCC)    CAD (coronary artery disease)    CAD in native artery 02/21/2015   Formatting of this note might be different from the original. nonobstructive disease by cath 2007   Chest discomfort 03/30/2018   Chiari I malformation (HCC)    Chronic obstructive pulmonary disease (HCC) 08/29/2019   Coronary artery disease involving native coronary artery of native heart without angina pectoris 02/21/2015   Dyslipidemia    GERD (gastroesophageal reflux disease)    Heart attack (HCC)    x2   History of angina    Malaise and fatigue 08/25/2017   Manic depression (HCC)    Migraine    PTSD (post-traumatic stress disorder)    since teenager    Smoking 02/21/2015   Unstable angina (HCC) 08/29/2019   Vertigo     Past Surgical History:  Procedure Laterality Date   EYE SURGERY Bilateral     HEMORRHOID SURGERY      Current Medications: Current Meds  Medication Sig   albuterol (VENTOLIN HFA) 108 (90 Base) MCG/ACT inhaler Inhale 2 puffs into the lungs every 6 (six) hours as needed for wheezing or shortness of breath.   ASPIRIN 81 PO Take 162 mg by mouth in the morning and at bedtime.    atorvastatin (LIPITOR) 40 MG tablet Take 1 tablet (40 mg total) by mouth daily.   busPIRone (BUSPAR) 30 MG tablet Take 30 mg by mouth 2 (two) times daily.   Cyanocobalamin (VITAMIN B12 PO) Take 1 tablet by mouth daily. Unknown strength   DULoxetine (CYMBALTA) 60 MG capsule Take 60 mg by mouth at bedtime.   EPIPEN 2-PAK 0.3 MG/0.3ML SOAJ injection Inject 0.3 mg into the muscle as needed for anaphylaxis.   fenofibrate (TRICOR) 145 MG tablet Take 145 mg by mouth daily.   FLOVENT HFA 110 MCG/ACT inhaler Inhale 2 puffs into the lungs 2 (two) times daily. Use with spacer device.   FLUoxetine (PROZAC) 20 MG tablet Take 20 mg by mouth daily.   KRILL OIL PO Take 1 capsule by mouth daily. Unknown strength   lamoTRIgine (LAMICTAL) 200 MG tablet Take 200 mg by mouth in the morning.   lamoTRIgine (LAMICTAL) 25 MG tablet Take 25 mg by mouth every  evening.   levocetirizine (XYZAL) 5 MG tablet Take 1 tablet (5 mg total) by mouth daily as needed. (Patient taking differently: Take 5 mg by mouth daily as needed for allergies.)   meloxicam (MOBIC) 7.5 MG tablet Take 7.5 mg by mouth 2 (two) times daily.   montelukast (SINGULAIR) 10 MG tablet TAKE ONE TABLET BY MOUTH AT BEDTIME (Patient taking differently: Take 10 mg by mouth at bedtime.)   nitroGLYCERIN (NITROSTAT) 0.4 MG SL tablet Place 1 tablet (0.4 mg total) under the tongue every 5 (five) minutes as needed for chest pain.   pantoprazole (PROTONIX) 40 MG tablet Take 40 mg by mouth 2 (two) times daily.    VITAMIN D, ERGOCALCIFEROL, PO Take 1 tablet by mouth daily. Unknown strength   VITAMIN E PO Take 1 capsule by mouth in the morning and at bedtime. Unknown  strength   Wheat Dextrin (BENEFIBER PO) Take 1 tablet by mouth daily. Unknown strenght   [DISCONTINUED] metoprolol tartrate (LOPRESSOR) 25 MG tablet Take 1 tablet (25 mg total) by mouth 2 (two) times daily.     Allergies:   Morphine, Codeine, Mushroom extract complex, Other, and Sulfa antibiotics   Social History   Socioeconomic History   Marital status: Married    Spouse name: Not on file   Number of children: 1   Years of education: Not on file   Highest education level: GED or equivalent  Occupational History   Occupation: disabled  Tobacco Use   Smoking status: Every Day    Packs/day: 1.50    Years: 44.00    Total pack years: 66.00    Types: Cigarettes    Start date: 41   Smokeless tobacco: Former    Types: Snuff   Tobacco comments:    used to smoke cigars & pipes  Vaping Use   Vaping Use: Never used  Substance and Sexual Activity   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   Drug use: Not Currently   Sexual activity: Not on file  Other Topics Concern   Not on file  Social History Narrative   Lives in an apt with his wife   Right handed   Caffeine: coffee 2 cups daily, root beer occasional    Social Determinants of Health   Financial Resource Strain: Not on file  Food Insecurity: Not on file  Transportation Needs: Not on file  Physical Activity: Not on file  Stress: Not on file  Social Connections: Not on file     Family History: The patient's family history includes Cancer in his maternal grandfather, maternal grandmother, paternal grandfather, paternal grandmother, and sister; Heart Problems in his father; Hypertension in his father; Migraines in his maternal aunt; Other in his father and maternal aunt; Stroke in his father. ROS:   Please see the history of present illness.    All 14 point review of systems negative except as described per history of present illness  EKGs/Labs/Other Studies Reviewed:      Recent Labs: No  results found for requested labs within last 365 days.  Recent Lipid Panel    Component Value Date/Time   CHOL 185 10/24/2021 0951   TRIG 403 (H) 10/24/2021 0951   HDL 39 (L) 10/24/2021 0951   CHOLHDL 4.7 10/24/2021 0951   CHOLHDL 3.4 08/29/2019 0809   VLDL 17 08/29/2019 0809   LDLCALC 81 10/24/2021 0951    Physical Exam:    VS:  There were no vitals taken for this  visit.    Wt Readings from Last 3 Encounters:  10/24/21 219 lb 6.4 oz (99.5 kg)  05/13/21 230 lb 12.8 oz (104.7 kg)  11/14/20 227 lb (103 kg)     GEN:  Well nourished, well developed in no acute distress HEENT: Normal NECK: No JVD; No carotid bruits LYMPHATICS: No lymphadenopathy CARDIAC: RRR, no murmurs, no rubs, no gallops RESPIRATORY:  Clear to auscultation without rales, wheezing or rhonchi  ABDOMEN: Soft, non-tender, non-distended MUSCULOSKELETAL:  No edema; No deformity  SKIN: Warm and dry LOWER EXTREMITIES: no swelling NEUROLOGIC:  Alert and oriented x 3 PSYCHIATRIC:  Normal affect   ASSESSMENT:    1. Chest pain of uncertain etiology   2. Coronary artery disease involving native coronary artery of native heart without angina pectoris   3. Gastroesophageal reflux disease, unspecified whether esophagitis present   4. Dyslipidemia   5. Smoking   6. Chronic obstructive pulmonary disease, unspecified COPD type (HCC)    PLAN:    In order of problems listed above:  Chest pain uncertain etiology, he is EKG today showed no acute changes, I offered him hospitalization, he declined.  He is taking antiplatelets therapy which I will continue, will do troponin I today.  I told him if pain get worse or if he decided to go to the emergency room he need to do so. Smoking obviously still a problem we had a long discussion as longer recommended to quit however honestly I doubt he be able to accomplish that. Dyslipidemia, fasting lipid profile done months ago show LDL of 79 HDL 35 however problems triglycerides 382.   Diet good exercise as well as sugar control was discussed with the patient.   1 more time he was told to go to the emergency room if pain gets worse or if he simply decide to do go.  He refused to be admitted from my office   Medication Adjustments/Labs and Tests Ordered: Current medicines are reviewed at length with the patient today.  Concerns regarding medicines are outlined above.  Orders Placed This Encounter  Procedures   Troponin T, STAT ([facility])   EKG 12-Lead   Medication changes: No orders of the defined types were placed in this encounter.   Signed, Georgeanna Lea, MD, [facility] 01/02/2022 12:00 PM    [facility]

## 2022-01-02 NOTE — Patient Instructions (Signed)
Medication Instructions:  Your physician has recommended you make the following change in your medication:   START: Metoprolol tartrate 12.5 mg twice daily  *If you need a refill on your cardiac medications before your next appointment, please call your pharmacy*   Lab Work: Your physician recommends that you return for lab work in:   Labs today: Troponin 1 STAT  If you have labs (blood work) drawn today and your tests are completely normal, you will receive your results only by: MyChart Message (if you have MyChart) OR A paper copy in the mail If you have any lab test that is abnormal or we need to change your treatment, we will call you to review the results.   Testing/Procedures: None   Follow-Up: At [facility], you and your health needs are our priority.  As part of our continuing mission to provide you with exceptional heart care, we have created designated Provider Care Teams.  These Care Teams include your primary Cardiologist (physician) and Advanced Practice Providers (APPs -  Physician Assistants and Nurse Practitioners) who all work together to provide you with the care you need, when you need it.  We recommend signing up for the patient portal called "MyChart".  Sign up information is provided on this After Visit Summary.  MyChart is used to connect with patients for Virtual Visits (Telemedicine).  Patients are able to view lab/test results, encounter notes, upcoming appointments, etc.  Non-urgent messages can be sent to your provider as well.   To learn more about what you can do with MyChart, go to [email or web address].    Your next appointment:   3 month(s)  The format for your next appointment:   In Person  Provider:   Gypsy Balsam, MD    Other Instructions None  Important Information About Sugar

## 2022-01-07 ENCOUNTER — Telehealth: Payer: Self-pay

## 2022-01-07 NOTE — Telephone Encounter (Signed)
Patient notified of results.

## 2022-01-07 NOTE — Telephone Encounter (Signed)
-----   Message from Georgeanna Lea, MD sent at 01/03/2022 11:40 AM EDT ----- Troponin I negative

## 2022-01-08 ENCOUNTER — Other Ambulatory Visit: Payer: Self-pay

## 2022-01-08 MED ORDER — EPINEPHRINE 0.3 MG/0.3ML IJ SOAJ: 0.3000 mg | INTRAMUSCULAR | 1 refills | Status: DC | PRN

## 2022-02-15 ENCOUNTER — Other Ambulatory Visit: Payer: Self-pay | Admitting: Allergy

## 2022-03-17 ENCOUNTER — Encounter: Payer: Self-pay | Admitting: Allergy

## 2022-03-17 ENCOUNTER — Ambulatory Visit (INDEPENDENT_AMBULATORY_CARE_PROVIDER_SITE_OTHER): Payer: Medicare Other | Admitting: Allergy

## 2022-03-17 VITALS — BP 108/60 | HR 76 | Resp 16

## 2022-03-17 DIAGNOSIS — J310 Chronic rhinitis: Secondary | ICD-10-CM | POA: Diagnosis not present

## 2022-03-17 DIAGNOSIS — F17200 Nicotine dependence, unspecified, uncomplicated: Secondary | ICD-10-CM | POA: Diagnosis not present

## 2022-03-17 DIAGNOSIS — J4540 Moderate persistent asthma, uncomplicated: Secondary | ICD-10-CM

## 2022-03-17 DIAGNOSIS — Z91018 Allergy to other foods: Secondary | ICD-10-CM | POA: Diagnosis not present

## 2022-03-17 NOTE — Progress Notes (Signed)
Follow-up Note  RE: Reginald Nguyen MRN: [id number] DOB: 11/13/1960 Date of Office Visit: 03/17/2022   History of present illness: Reginald Nguyen is a 61 y.o. male presenting today for skin testing today.  He was see non 01/01/22 by our nurse practitioner Amada Jupiter.  Unable to skin test at last visit as he was having increased asthma symptoms and was started on Flovent that he states he has been using 2 puffs twice a day.  He states his breathing is much better now since starting on Flovent.  Denies frequent albuterol needs. He has held antihistamines for at least 3 days for testing today.  He continues to smoke on a regular basis.  He continues to avoid mushroom and reports access to his epinephrine device.   Review of systems: Review of Systems  Constitutional: Negative.   HENT: Negative.    Eyes: Negative.   Respiratory: Negative.    Cardiovascular: Negative.   Musculoskeletal: Negative.   Skin: Negative.   Allergic/Immunologic: Negative.   Neurological: Negative.      All other systems negative unless noted above in HPI  Past medical/social/surgical/family history have been reviewed and are unchanged unless specifically indicated below.  No changes  Medication List: Current Outpatient Medications  Medication Sig Dispense Refill   albuterol (VENTOLIN HFA) 108 (90 Base) MCG/ACT inhaler Inhale 2 puffs into the lungs every 6 (six) hours as needed for wheezing or shortness of breath. 18 g 1   ASPIRIN 81 PO Take 162 mg by mouth in the morning and at bedtime.      atorvastatin (LIPITOR) 40 MG tablet Take 1 tablet (40 mg total) by mouth daily. 30 tablet 0   busPIRone (BUSPAR) 30 MG tablet Take 30 mg by mouth 2 (two) times daily.     Cyanocobalamin (VITAMIN B12 PO) Take 1 tablet by mouth daily. Unknown strength     DULoxetine (CYMBALTA) 60 MG capsule Take 60 mg by mouth at bedtime.     EPINEPHrine (EPIPEN 2-PAK) 0.3 mg/0.3 mL IJ SOAJ injection Inject 0.3 mg into the muscle as needed for  anaphylaxis. 2 each 1   fenofibrate (TRICOR) 145 MG tablet Take 145 mg by mouth daily.     FLOVENT HFA 110 MCG/ACT inhaler Inhale 2 puffs into the lungs 2 (two) times daily. Use with spacer device. 1 each 5   FLUoxetine (PROZAC) 20 MG tablet Take 20 mg by mouth daily.     KRILL OIL PO Take 1 capsule by mouth daily. Unknown strength     lamoTRIgine (LAMICTAL) 200 MG tablet Take 200 mg by mouth in the morning.     lamoTRIgine (LAMICTAL) 25 MG tablet Take 25 mg by mouth every evening.     levocetirizine (XYZAL) 5 MG tablet Take 1 tablet (5 mg total) by mouth daily as needed. (Patient taking differently: Take 5 mg by mouth daily as needed for allergies.) 30 tablet 11   meloxicam (MOBIC) 7.5 MG tablet Take 7.5 mg by mouth 2 (two) times daily.     metoprolol tartrate (LOPRESSOR) 25 MG tablet Take 0.5 tablets (12.5 mg total) by mouth 2 (two) times daily. 90 tablet 3   montelukast (SINGULAIR) 10 MG tablet Take 1 tablet (10 mg total) by mouth at bedtime. 30 tablet 2   nitroGLYCERIN (NITROSTAT) 0.4 MG SL tablet Place 1 tablet (0.4 mg total) under the tongue every 5 (five) minutes as needed for chest pain. 30 tablet 3   pantoprazole (PROTONIX) 40 MG tablet Take 40 mg  by mouth 2 (two) times daily.      VITAMIN D, ERGOCALCIFEROL, PO Take 1 tablet by mouth daily. Unknown strength     VITAMIN E PO Take 1 capsule by mouth in the morning and at bedtime. Unknown strength     Wheat Dextrin (BENEFIBER PO) Take 1 tablet by mouth daily. Unknown strenght     No current facility-administered medications for this visit.     Known medication allergies: Allergies  Allergen Reactions   Morphine Other (See Comments) and Anaphylaxis    ask   Codeine Other (See Comments)    ask HIVES   Mushroom Extract Complex    Other     Lemons, Limes, Mushrooms  Has to have allergy shots   Sulfa Antibiotics Other (See Comments) and Diarrhea    IBS GI Upset     Physical examination: Blood pressure 108/60, pulse 76, resp.  rate 16, SpO2 99 %.  General: Alert, interactive, in no acute distress. HEENT: PERRLA, TMs pearly gray, turbinates non-edematous without discharge, post-pharynx non erythematous. Neck: Supple without lymphadenopathy. Lungs: Clear to auscultation without wheezing, rhonchi or rales. {no increased work of breathing. CV: Normal S1, S2 without murmurs. Abdomen: Nondistended, nontender. Skin: Warm and dry, without lesions or rashes. Extremities:  No clubbing, cyanosis or edema. Neuro:   Grossly intact.  Diagnositics/Labs:  Allergy testing:   Airborne Adult Perc - 03/17/22 1013     Time Antigen Placed 1013    Allergen Manufacturer Waynette Buttery    Location Back    Number of Test 59    Panel 1 Select    1. Control-Buffer 50% Glycerol Negative    2. Control-Histamine 1 mg/ml 2+    3. Albumin saline Negative    4. Bahia Negative    5. French Southern Territories Negative    6. Johnson Negative    7. Kentucky Blue Negative    8. Meadow Fescue Negative    9. Perennial Rye Negative    10. Sweet Vernal Negative    11. Timothy Negative    12. Cocklebur Negative    13. Burweed Marshelder Negative    14. Ragweed, short Negative    15. Ragweed, Giant Negative    16. Plantain,  English Negative    17. Lamb's Quarters Negative    18. Sheep Sorrell Negative    19. Rough Pigweed Negative    20. Marsh Elder, Rough Negative    21. Mugwort, Common Negative    22. Ash mix Negative    23. Birch mix Negative    24. Beech American Negative    25. Box, Elder Negative    26. Cedar, red Negative    27. Cottonwood, Guinea-Bissau Negative    28. Elm mix Negative    29. Hickory Negative    30. Maple mix Negative    31. Oak, Guinea-Bissau mix Negative    32. Pecan Pollen 2+    33. Pine mix Negative    34. Sycamore Eastern Negative    35. Walnut, Black Pollen Negative    36. Alternaria alternata Negative    37. Cladosporium Herbarum Negative    38. Aspergillus mix Negative    39. Penicillium mix Negative    40. Bipolaris  sorokiniana (Helminthosporium) Negative    41. Drechslera spicifera (Curvularia) Negative    42. Mucor plumbeus Negative    43. Fusarium moniliforme Negative    44. Aureobasidium pullulans (pullulara) Negative    45. Rhizopus oryzae Negative    46. Botrytis cinera Negative  47. Epicoccum nigrum Negative    48. Phoma betae Negative    49. Candida Albicans Negative    50. Trichophyton mentagrophytes Negative    51. Mite, D Farinae  5,000 AU/ml Negative    52. Mite, D Pteronyssinus  5,000 AU/ml Negative    53. Cat Hair 10,000 BAU/ml Negative    54.  Dog Epithelia Negative    55. Mixed Feathers Negative    56. Horse Epithelia Negative    57. Cockroach, German Negative    58. Mouse Negative    59. Tobacco Leaf Negative             Allergy testing results were read and interpreted by provider, documented by clinical staff.   Assessment and plan:   Allergic rhinitis     - environmental allergy testing today is positive to tree pollen     - allergen avoidance measures provided     - continue Xyzal 5mg  daily (can take an additional 1/2 to 1 whole pill for total 2 tabs a day if needed)     - continue Singulair 10mg  daily       - use nasal saline spray to help keep nose moisturized  Asthma Tobacco use     - asthma control is much better on inhaled steroid     - continue Flovent 110 mcg 2 puffs twice a day with spacer to help prevent cough and wheeze.  Rinse mouth out afterwards to help prevent thrush (yeast).       - singulair as above     - have access to albuterol inhaler 2 puffs every 4-6 hours as needed for cough/wheeze/shortness of breath/chest tightness.  May use 15-20 minutes prior to activity.   Monitor frequency of use.      - recommend you think about quitting smoking.  Information has been previously provided  Food allergy     - continue avoidance of mushroom      - follow emergency action plan and have access to EpiPen 0.3 mg for use in case of allergic  reaction  Follow-up in 6 months or sooner if needed  I appreciate the opportunity to take part in Brylin's care. Please do not hesitate to contact me with questions.  Sincerely,   , MD Allergy/Immunology [facility]

## 2022-03-17 NOTE — Patient Instructions (Signed)
Allergies     - environmental allergy testing today is positive to tree pollen     - allergen avoidance measures provided     - continue Xyzal 5mg  daily (can take an additional 1/2 to 1 whole pill for total 2 tabs a day if needed)     - continue Singulair 10mg  daily       - use nasal saline spray to help keep nose moisturized  Asthma     - continue Flovent 110 mcg 2 puffs twice a day with spacer to help prevent cough and wheeze.  Rinse mouth out afterwards to help prevent thrush (yeast).       - singulair as above     - have access to albuterol inhaler 2 puffs every 4-6 hours as needed for cough/wheeze/shortness of breath/chest tightness.  May use 15-20 minutes prior to activity.   Monitor frequency of use.      - recommend you think about quitting smoking.  Information has been previously provided  Food allergy     - continue avoidance of mushroom      - follow emergency action plan and have access to EpiPen 0.3 mg for use in case of allergic reaction  Follow-up in 6 months or sooner if needed

## 2022-03-19 DIAGNOSIS — R079 Chest pain, unspecified: Secondary | ICD-10-CM | POA: Diagnosis not present

## 2022-03-19 DIAGNOSIS — I440 Atrioventricular block, first degree: Secondary | ICD-10-CM | POA: Diagnosis not present

## 2022-03-30 ENCOUNTER — Ambulatory Visit: Payer: Medicare Other | Admitting: Podiatry

## 2022-04-06 ENCOUNTER — Ambulatory Visit (INDEPENDENT_AMBULATORY_CARE_PROVIDER_SITE_OTHER): Payer: Medicare Other | Admitting: Podiatry

## 2022-04-06 DIAGNOSIS — Z91199 Patient's noncompliance with other medical treatment and regimen due to unspecified reason: Secondary | ICD-10-CM

## 2022-04-06 NOTE — Progress Notes (Signed)
Pt was a no show for apt 

## 2022-04-10 ENCOUNTER — Ambulatory Visit: Payer: Medicare Other | Admitting: Cardiology

## 2022-04-20 ENCOUNTER — Ambulatory Visit (INDEPENDENT_AMBULATORY_CARE_PROVIDER_SITE_OTHER): Payer: Medicare Other | Admitting: Podiatry

## 2022-04-20 DIAGNOSIS — B351 Tinea unguium: Secondary | ICD-10-CM

## 2022-04-20 DIAGNOSIS — M79609 Pain in unspecified limb: Secondary | ICD-10-CM | POA: Diagnosis not present

## 2022-04-20 DIAGNOSIS — M6740 Ganglion, unspecified site: Secondary | ICD-10-CM

## 2022-04-20 NOTE — Progress Notes (Signed)
  Subjective:  Patient ID: Reginald Nguyen, male    DOB: 1961/05/25,  MRN: [id number]  Chief Complaint  Patient presents with   Nail Problem     L ingrown toe, 2nd toe, R ingrown toe nail, discoloration    61 y.o. male presents with the above complaint. History confirmed with patient. Patient presenting with pain related to dystrophic thickened elongated nails.  Reports some pain in the bilateral hallux nail due to nail curvature both borders bilaterally.  Patient is unable to trim own nails related to nail dystrophy and/or mobility issues. Patient does not have a history of T2DM.   Objective:  Physical Exam: warm, good capillary refill, DP and PT pulses palpable bilateral nail exam onychomycosis of the toenails, mild ingrown nail at bilateral hallux nail distally but improved after slant back procedure, onycholysis, and dystrophic nails DP pulses palpable, PT pulses palpable, and protective sensation intact Left Foot:  Pain with palpation of nails due to elongation and dystrophic growth.  Right Foot: Pain with palpation of nails due to elongation and dystrophic growth. Mucoid cyst of right 2nd toe DIPJ non tender to palpation.  Assessment:   1. Pain due to onychomycosis of nail   2. Mucoid cyst of joint      Plan:  Patient was evaluated and treated and all questions answered.  #Mucoid cyst right 2nd toe - No pain and stable from prior, toe spacer to prevent pressure on the area provided.   #Onychomycosis with pain  -Nails palliatively debrided as below. -Educated on self-care  Procedure: Nail Debridement Rationale: Pain Type of Debridement: manual, sharp debridement. Instrumentation: Nail nipper, rotary burr. Number of Nails: 10  Return in about 3 months (around 07/21/2022) for RFC.         Everitt Amber, DPM [facility]

## 2022-05-10 ENCOUNTER — Other Ambulatory Visit: Payer: Self-pay | Admitting: Allergy

## 2022-05-13 ENCOUNTER — Telehealth: Payer: Self-pay | Admitting: Gastroenterology

## 2022-05-13 ENCOUNTER — Encounter: Payer: Self-pay | Admitting: Family Medicine

## 2022-05-13 NOTE — Telephone Encounter (Signed)
Request received to transfer GI care from outside practice to [facility].  We appreciate the interest in our practice however, due to capacity and high demand from patients without established GI providers we cannot accommodate this transfer.   

## 2022-05-13 NOTE — Telephone Encounter (Signed)
Good afternoon Dr Russella Dar  Supervising MD 9/12 AM  We have received a referral for patient to be seen for a colon cancer screening. Patient states his last procedure was over 10 years. But patient has records available for review in epics from [facility] gastro.  Please review and advise on scheduling.  Thank you

## 2022-05-13 NOTE — Telephone Encounter (Signed)
error 

## 2022-05-20 NOTE — Telephone Encounter (Signed)
Patient has been made aware of decision.

## 2022-06-12 ENCOUNTER — Ambulatory Visit: Payer: Medicare Other | Attending: Cardiology | Admitting: Cardiology

## 2022-06-12 VITALS — BP 108/62 | HR 80 | Ht 71.0 in | Wt 207.0 lb

## 2022-06-12 DIAGNOSIS — E785 Hyperlipidemia, unspecified: Secondary | ICD-10-CM

## 2022-06-12 DIAGNOSIS — J449 Chronic obstructive pulmonary disease, unspecified: Secondary | ICD-10-CM | POA: Diagnosis not present

## 2022-06-12 DIAGNOSIS — F319 Bipolar disorder, unspecified: Secondary | ICD-10-CM | POA: Diagnosis not present

## 2022-06-12 DIAGNOSIS — F17200 Nicotine dependence, unspecified, uncomplicated: Secondary | ICD-10-CM

## 2022-06-12 DIAGNOSIS — I2510 Atherosclerotic heart disease of native coronary artery without angina pectoris: Secondary | ICD-10-CM | POA: Diagnosis not present

## 2022-06-12 DIAGNOSIS — I739 Peripheral vascular disease, unspecified: Secondary | ICD-10-CM

## 2022-06-12 DIAGNOSIS — IMO0001 Reserved for inherently not codable concepts without codable children: Secondary | ICD-10-CM

## 2022-06-12 MED ORDER — NITROGLYCERIN 0.4 MG SL SUBL: 0.4000 mg | SUBLINGUAL_TABLET | SUBLINGUAL | 11 refills | Status: DC | PRN

## 2022-06-12 MED ORDER — NITROGLYCERIN 0.4 MG SL SUBL: 0.4000 mg | SUBLINGUAL_TABLET | SUBLINGUAL | 3 refills | Status: DC | PRN

## 2022-06-12 NOTE — Patient Instructions (Signed)
Medication Instructions:  Your physician recommends that you continue on your current medications as directed. Please refer to the Current Medication list given to you today.  *If you need a refill on your cardiac medications before your next appointment, please call your pharmacy*   Lab Work: None Ordered If you have labs (blood work) drawn today and your tests are completely normal, you will receive your results only by: MyChart Message (if you have MyChart) OR A paper copy in the mail If you have any lab test that is abnormal or we need to change your treatment, we will call you to review the results.   Testing/Procedures: Your physician has requested that you have a lower extremity arterial duplex. This test is an ultrasound of the arteries in the legs. It looks at arterial blood flow in the legs. Allow one hour for Lower Arterial scans. There are no restrictions or special instructions    Follow-Up: At [facility], you and your health needs are our priority.  As part of our continuing mission to provide you with exceptional heart care, we have created designated Provider Care Teams.  These Care Teams include your primary Cardiologist (physician) and Advanced Practice Providers (APPs -  Physician Assistants and Nurse Practitioners) who all work together to provide you with the care you need, when you need it.  We recommend signing up for the patient portal called "MyChart".  Sign up information is provided on this After Visit Summary.  MyChart is used to connect with patients for Virtual Visits (Telemedicine).  Patients are able to view lab/test results, encounter notes, upcoming appointments, etc.  Non-urgent messages can be sent to your provider as well.   To learn more about what you can do with MyChart, go to [email or web address].    Your next appointment:   6 month(s)  The format for your next appointment:   In Person  Provider:   Robert Krasowski, MD    Other  Instructions NA  

## 2022-06-12 NOTE — Addendum Note (Signed)
Addended by: Baldo Ash D on: 06/12/2022 01:15 PM   Modules accepted: Orders

## 2022-06-12 NOTE — Progress Notes (Signed)
Cardiology Office Note:    Date:  06/12/2022   ID:  Reginald Nguyen, DOB [birth year], MRN [id number]  PCP:  Yisroel Ramming, MD  Cardiologist:  Gypsy Balsam, MD    Referring MD: Yisroel Ramming, MD   Chief Complaint  Patient presents with   Follow-up    History of Present Illness:    Reginald Nguyen is a 61 y.o. male with past medical history significant for coronary artery disease.  He did have cardiac catheterization done in 2007 which showed minimal luminal nonobstructive disease in the it was repeated in 2021 with similar findings he also got stress test done recently which showed no evidence of ischemia.  Additional problem include smoking which is still ongoing, dyslipidemia, bipolar disorder. He comes today to [place] for follow-up overall he says he is doing fine he however complain of having some tingling in his right leg I did check pulses still somewhat weak he is a chronic smoker we will do arterial duplex evaluation of lower extremities denies have any chest pain tightness squeezing pressure burning chest.  Sadly still continues to smoke  Past Medical History:  Diagnosis Date   Acid reflux    Asthma    Barrett's esophagus    Bipolar 1 disorder (HCC) 08/29/2019   Bipolar disorder (HCC)    CAD (coronary artery disease)    CAD in native artery 02/21/2015   Formatting of this note might be different from the original. nonobstructive disease by cath 2007   Chest discomfort 03/30/2018   Chiari I malformation (HCC)    Chronic obstructive pulmonary disease (HCC) 08/29/2019   Coronary artery disease involving native coronary artery of native heart without angina pectoris 02/21/2015   Dyslipidemia    GERD (gastroesophageal reflux disease)    Heart attack (HCC)    x2   History of angina    Malaise and fatigue 08/25/2017   Manic depression (HCC)    Migraine    PTSD (post-traumatic stress disorder)    since teenager    Smoking 02/21/2015   Unstable angina (HCC) 08/29/2019   Vertigo      Past Surgical History:  Procedure Laterality Date   EYE SURGERY Bilateral    HEMORRHOID SURGERY      Current Medications: Current Meds  Medication Sig   ASPIRIN 81 PO Take 162 mg by mouth in the morning and at bedtime.    atorvastatin (LIPITOR) 40 MG tablet Take 1 tablet (40 mg total) by mouth daily.   busPIRone (BUSPAR) 30 MG tablet Take 50 mg by mouth daily.   clonazePAM (KLONOPIN) 1 MG tablet SMARTSIG:1.5 Tablet(s) By Mouth Every Evening PRN   Cyanocobalamin (VITAMIN B12 PO) Take 1 tablet by mouth daily. Unknown strength   doxepin (SINEQUAN) 50 MG capsule Take 100 mg by mouth at bedtime as needed.   fenofibrate (TRICOR) 145 MG tablet Take 1 tablet by mouth daily.   FLUoxetine (PROZAC) 20 MG tablet Take 20 mg by mouth daily.   gabapentin (NEURONTIN) 400 MG capsule Take 400 mg by mouth 3 (three) times daily.   ibuprofen (ADVIL) 600 MG tablet Take 600 mg by mouth.   KRILL OIL PO Take 1 capsule by mouth daily. Unknown strength   lamoTRIgine (LAMICTAL) 200 MG tablet Take 200 mg by mouth in the morning.   meloxicam (MOBIC) 7.5 MG tablet Take 7.5 mg by mouth 2 (two) times daily.   metoprolol tartrate (LOPRESSOR) 25 MG tablet Take 0.5 tablets (12.5 mg total) by mouth 2 (two) times daily.  nitroGLYCERIN (NITROSTAT) 0.4 MG SL tablet Place 1 tablet (0.4 mg total) under the tongue every 5 (five) minutes as needed for chest pain.   pantoprazole (PROTONIX) 40 MG tablet Take 40 mg by mouth 2 (two) times daily.      Allergies:   Morphine, Calcium oxide, Codeine, Mushroom extract complex, Other, and Sulfa antibiotics   Social History   Socioeconomic History   Marital status: Married    Spouse name: Not on file   Number of children: 1   Years of education: Not on file   Highest education level: GED or equivalent  Occupational History   Occupation: disabled  Tobacco Use   Smoking status: Every Day    Packs/day: 1.50    Years: 44.00    Total pack years: 66.00    Types: Cigarettes     Start date: 12   Smokeless tobacco: Former    Types: Snuff   Tobacco comments:    used to smoke cigars & pipes  Vaping Use   Vaping Use: Never used  Substance and Sexual Activity   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   Drug use: Not Currently   Sexual activity: Not on file  Other Topics Concern   Not on file  Social History Narrative   Lives in an apt with his wife   Right handed   Caffeine: coffee 2 cups daily, root beer occasional    Social Determinants of Health   Financial Resource Strain: Not on file  Food Insecurity: Not on file  Transportation Needs: Not on file  Physical Activity: Not on file  Stress: Not on file  Social Connections: Not on file     Family History: The patient's family history includes Cancer in his maternal grandfather, maternal grandmother, paternal grandfather, paternal grandmother, and sister; Heart Problems in his father; Hypertension in his father; Migraines in his maternal aunt; Other in his father and maternal aunt; Stroke in his father. ROS:   Please see the history of present illness.    All 14 point review of systems negative except as described per history of present illness  EKGs/Labs/Other Studies Reviewed:      Recent Labs: No results found for requested labs within last 365 days.  Recent Lipid Panel    Component Value Date/Time   CHOL 185 10/24/2021 0951   TRIG 403 (H) 10/24/2021 0951   HDL 39 (L) 10/24/2021 0951   CHOLHDL 4.7 10/24/2021 0951   CHOLHDL 3.4 08/29/2019 0809   VLDL 17 08/29/2019 0809   LDLCALC 81 10/24/2021 0951    Physical Exam:    VS:  BP 108/62 (BP Location: Right Arm, Patient Position: Sitting)   Pulse 80   Ht 5\' 11"  (1.803 m)   Wt 207 lb (93.9 kg)   SpO2 97%   BMI 28.87 kg/m     Wt Readings from Last 3 Encounters:  06/12/22 207 lb (93.9 kg)  10/24/21 219 lb 6.4 oz (99.5 kg)  05/13/21 230 lb 12.8 oz (104.7 kg)     GEN:  Well nourished, well developed in no acute  distress HEENT: Normal NECK: No JVD; No carotid bruits LYMPHATICS: No lymphadenopathy CARDIAC: RRR, no murmurs, no rubs, no gallops RESPIRATORY:  Clear to auscultation without rales, wheezing or rhonchi  ABDOMEN: Soft, non-tender, non-distended MUSCULOSKELETAL:  No edema; No deformity  SKIN: Warm and dry LOWER EXTREMITIES: no swelling NEUROLOGIC:  Alert and oriented x 3 PSYCHIATRIC:  Normal affect   ASSESSMENT:  1. Coronary artery disease involving native coronary artery of native heart without angina pectoris   2. Chronic obstructive pulmonary disease, unspecified COPD type (HCC)   3. Bipolar 1 disorder (HCC)   4. Smoking   5. Dyslipidemia    PLAN:    In order of problems listed above:  Coronary disease only luminal on cardiac catheterization recent stress test normal, I will continue antiplatelet therapy statin and trying to convince him to quit smoking COPD present progressive because of continuation of smoking.  I spent at least 10 minutes talking about that Bipolar disorder stable Smoking again long discussion about it Dyslipidemia he is taking Lipitor 40 which I will continue I did review K PN which show me data from April with LDL 81 HDL 39 we will repeat the test. Strength sensation his lower extremities with poor pulses.  Will do ABIs and arterial duplex evaluation of lower extremities   Medication Adjustments/Labs and Tests Ordered: Current medicines are reviewed at length with the patient today.  Concerns regarding medicines are outlined above.  No orders of the defined types were placed in this encounter.  Medication changes: No orders of the defined types were placed in this encounter.   Signed, Georgeanna Lea, MD, [facility] 06/12/2022 1:01 PM    [facility]

## 2022-06-30 ENCOUNTER — Ambulatory Visit: Payer: Medicare Other | Attending: Cardiology

## 2022-06-30 DIAGNOSIS — I739 Peripheral vascular disease, unspecified: Secondary | ICD-10-CM

## 2022-07-03 ENCOUNTER — Telehealth: Payer: Self-pay

## 2022-07-03 NOTE — Telephone Encounter (Signed)
LVM per DPR- per Dr. Krasowski's note regarding normal Echo results. Encouraged to call with any questions. Routed to PCP. 

## 2022-07-21 ENCOUNTER — Ambulatory Visit: Payer: Medicare Other | Admitting: Podiatry

## 2022-07-24 ENCOUNTER — Ambulatory Visit (INDEPENDENT_AMBULATORY_CARE_PROVIDER_SITE_OTHER): Payer: 59 | Admitting: Podiatry

## 2022-07-24 DIAGNOSIS — B351 Tinea unguium: Secondary | ICD-10-CM | POA: Diagnosis not present

## 2022-07-24 DIAGNOSIS — M79609 Pain in unspecified limb: Secondary | ICD-10-CM

## 2022-07-24 DIAGNOSIS — M6740 Ganglion, unspecified site: Secondary | ICD-10-CM

## 2022-07-24 NOTE — Progress Notes (Signed)
  Subjective:  Patient ID: Reginald Nguyen, male    DOB: [birth year],  MRN: [id number]  Chief Complaint  Patient presents with   Nail Problem    Thick painful toenails, 3 month follow up    62 y.o. male presents with the above complaint. History confirmed with patient. Patient presenting with pain related to dystrophic thickened elongated nails.  Reports some pain in the bilateral hallux nail due to nail curvature both borders bilaterally.  Patient is unable to trim own nails related to nail dystrophy and/or mobility issues. Patient does not have a history of T2DM.   Objective:  Physical Exam: warm, good capillary refill, DP and PT pulses palpable bilateral nail exam onychomycosis of the toenails, mild ingrown nail at bilateral hallux nail distally but improved after slant back procedure, onycholysis, and dystrophic nails DP pulses palpable, PT pulses palpable, and protective sensation intact Left Foot:  Pain with palpation of nails due to elongation and dystrophic growth.  Right Foot: Pain with palpation of nails due to elongation and dystrophic growth. Mucoid cyst of right 2nd toe DIPJ non tender to palpation.  Assessment:   No diagnosis found.    Plan:  Patient was evaluated and treated and all questions answered.  #Mucoid cyst right 2nd toe - No pain and stable from prior, toe spacer to prevent pressure on the area provided.   #Onychomycosis with pain  -Nails palliatively debrided as below. -Educated on self-care  Procedure: Nail Debridement Rationale: Pain Type of Debridement: manual, sharp debridement. Instrumentation: Nail nipper, rotary burr. Number of Nails: 10  No follow-ups on file.         Everitt Amber, DPM [facility]

## 2022-08-14 ENCOUNTER — Other Ambulatory Visit (INDEPENDENT_AMBULATORY_CARE_PROVIDER_SITE_OTHER): Payer: 59 | Admitting: Podiatry

## 2022-08-14 ENCOUNTER — Telehealth: Payer: Self-pay | Admitting: Podiatry

## 2022-08-14 DIAGNOSIS — M79609 Pain in unspecified limb: Secondary | ICD-10-CM

## 2022-08-14 DIAGNOSIS — B351 Tinea unguium: Secondary | ICD-10-CM

## 2022-08-14 MED ORDER — CICLOPIROX 8 % EX SOLN: Freq: Every day | CUTANEOUS | 0 refills | Status: DC

## 2022-08-14 NOTE — Telephone Encounter (Signed)
Pt called asking if Dr Loel Lofty could call in some medication for toenail fungus he has it on his big toes. He said it was like a polish that you paint on the nail. He uses [facility] in [place].  Pt is aware provider is out of the office until Monday 2.12

## 2022-08-14 NOTE — Progress Notes (Signed)
Penlac 8% solution sent per pt request

## 2022-10-23 ENCOUNTER — Ambulatory Visit: Payer: 59 | Admitting: Podiatry

## 2022-11-11 ENCOUNTER — Ambulatory Visit: Payer: 59 | Admitting: Podiatry

## 2022-11-11 ENCOUNTER — Ambulatory Visit (INDEPENDENT_AMBULATORY_CARE_PROVIDER_SITE_OTHER): Payer: 59 | Admitting: Podiatry

## 2022-11-11 DIAGNOSIS — M79609 Pain in unspecified limb: Secondary | ICD-10-CM | POA: Diagnosis not present

## 2022-11-11 DIAGNOSIS — B351 Tinea unguium: Secondary | ICD-10-CM

## 2022-11-11 MED ORDER — CICLOPIROX 8 % EX SOLN: Freq: Every day | CUTANEOUS | 0 refills | Status: AC

## 2022-11-11 NOTE — Progress Notes (Signed)
       Subjective:  Patient ID: Reginald Nguyen, male    DOB: [birth year],  MRN: [id number]  Reginald Nguyen presents to clinic today for:  Chief Complaint  Patient presents with   Nail Problem    Routine Foot Care  PCP:  Last visit: a couple months   . Patient notes nails are thick, discolored, elongated and painful in shoegear when trying to ambulate.  He is requesting a prescription topical antifungal be sent in for his toenails.  PCP is Yisroel Ramming, MD.  Allergies  Allergen Reactions   Morphine Other (See Comments) and Anaphylaxis    ask   Calcium Oxide Hives    Lemons and limes   Codeine Other (See Comments)    ask HIVES   Mushroom Extract Complex    Other     Lemons, Limes, Mushrooms  Has to have allergy shots   Sulfa Antibiotics Other (See Comments) and Diarrhea    IBS GI Upset     Review of Systems: Negative except as noted in the HPI.  Reginald Nguyen is a pleasant 62 y.o. male in NAD. AAO x 3.  Vascular Examination: Capillary refill time is 3-5 seconds to toes bilateral. Palpable pedal pulses b/l LE. Digital hair present b/l. No pedal edema b/l. Skin temperature gradient WNL b/l. No varicosities b/l. No cyanosis or clubbing noted b/l.   Dermatological Examination: Pedal skin with normal turgor, texture and tone b/l. No open wounds. No interdigital macerations b/l. Toenails 1-5 b/l thickened 3mm, discolored, dystrophic with subungual debris. There is pain with compression of the nail plates.  They are elongated x10.  The patient has a digital mucoid cyst on the dorsal aspect of the right second toe at the DIP joint which is slightly medial to central line.  This is very small and asymptomatic at this time.  Neurological Examination: Protective sensation intact with Semmes-Weinstein 10 gram monofilament b/l LE. Vibratory sensation intact b/l LE.  Musculoskeletal Examination: Muscle strength 5/5 to all LE muscle groups b/l.   Assessment/Plan: #1 onychomycosis with  pain.  Meds ordered this encounter  Medications   ciclopirox (PENLAC) 8 % solution    Sig: Apply topically at bedtime. Apply over nail and surrounding skin. Apply daily over previous coat. After seven (7) days, may remove with alcohol and continue cycle.    Dispense:  6.6 mL    Refill:  0   Mycotic nails were debrided with sterile nail nippers and a power debriding bur.  Prescription for ciclopirox was sent to patient's pharmacy to apply the solution daily to the affected toenails.  Recommend follow-up in 3 months  Return in about 3 months (around 02/11/2023) for RFC.   Clerance Lav, DPM, FACFAS [facility]     [address] [place], [place] [address]                Office [phone]  Fax [phone]

## 2022-11-17 ENCOUNTER — Other Ambulatory Visit: Payer: Self-pay | Admitting: Cardiology

## 2022-11-23 ENCOUNTER — Telehealth: Payer: Self-pay | Admitting: Cardiology

## 2022-11-23 MED ORDER — NITROGLYCERIN 0.4 MG SL SUBL: 0.4000 mg | SUBLINGUAL_TABLET | SUBLINGUAL | 11 refills | Status: DC | PRN

## 2022-11-23 NOTE — Telephone Encounter (Signed)
Advised to see his PCP for the numbness in his foot.  Pt states that the pain in his chest in near the bone (sternum) comes and goes and is an aching pain. Pt has not tried NTG as he they are out of date. RX sent to the pharmacy and pt aware to try for pain.   Pt states that his BP has been on the lower side of normal and at times he gets dizzy with changing in positions. Advised to keep a log of his BP when is sitting and after he stands up and let us know what they are. Pt reports that he drinks approximately 1 gallon of water a day. Pt also states that he is in the 190's and he has lost weight.  Please advise

## 2022-11-23 NOTE — Telephone Encounter (Signed)
Pt states his foot has been going numb. He states his "heart bone" has been hurting. I asked if it was chest pain he stated "no not really" . He denies any other symptoms no sob, nausea, vomiting, or sweating. Please advise.

## 2022-12-14 ENCOUNTER — Ambulatory Visit: Payer: Medicaid Other | Attending: Cardiology | Admitting: Cardiology

## 2022-12-31 ENCOUNTER — Telehealth: Payer: Self-pay | Admitting: Cardiology

## 2022-12-31 NOTE — Telephone Encounter (Signed)
Pt is calling regarding him walking to the store and now his right leg is having some pain. Pt is requesting a callback.

## 2022-12-31 NOTE — Telephone Encounter (Signed)
Appointment made for pt with Reginald Nguyen as he has multiple complaints that have been ongoing since before his May call.

## 2023-01-10 NOTE — Progress Notes (Unsigned)
Cardiology Office Note:  .   Date:  01/11/2023  ID:  Reginald Nguyen, DOB 10/18/1960, MRN [id number] PCP: Reginald Pringle, PA-C  [facility] Providers Cardiologist:  Diamond Nickel, MD    History of Present Illness: .    Reginald Nguyen is a 62 y.o. 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  06/30/2022 vascular ultrasound ABI was normal 03/18/2022 MPI revealed no ischemia 12/10/2020 echocardiogram EF 55 to 60%, mild concentric LVH, grade 1 DD, normal PASP, no valvular abnormalities, moderate dilatation of the aortic root 39 mm. 12/04/2020 monitor revealed average heart rate 71 bpm, predominant underlying rhythm was sinus, first-degree AV block was present, 2 episodes of SVT occurred 09/23/2019 left heart cath at [facility] revealed mild nonobstructive CAD, mid LAD 20% stenosed, proximal RCA 25% stenosed  Most recently evaluated by Dr. Bing Matter on 06/12/2022, he had some complaints of tingling in his right leg, ABI was arranged and completed on 06/30/2022 which was normal.  He presents today for follow-up with concerns of right foot tingling.  States this can occur at any time, describes a paresthesia type sensation.  Recent ABI was unrevealing.  He denies chest pain, palpitations, dyspnea, pnd, orthopnea, n, v, dizziness, syncope, edema, weight gain, or early satiety.  ROS: Review of Systems  Constitutional: Negative.   HENT: Negative.    Eyes: Negative.   Respiratory: Negative.    Cardiovascular: Negative.   Gastrointestinal: Negative.   Genitourinary: Negative.   Musculoskeletal:  Positive for back pain and neck pain.  Skin: Negative.   Neurological:  Positive for tingling (RLL).  Endo/Heme/Allergies: Negative.   Psychiatric/Behavioral: Negative.      Studies Reviewed: Marland Kitchen   EKG Interpretation Date/Time:  Monday January 11 2023 09:44:05  EDT Ventricular Rate:  67 PR Interval:  238 QRS Duration:  82 QT Interval:  414 QTC Calculation: 437 R Axis:   -82  Text Interpretation: Sinus rhythm with 1st degree A-V block Left axis deviation Abnormal ECG When compared with ECG of 30-Aug-2019 05:34, QRS axis Shifted left Confirmed by [name] [phone]) on 01/11/2023 11:56:24 AM    Cardiac Studies & Procedures     STRESS TESTS  NM MYOCAR MULTI W/SPECT W 08/30/2019  Narrative CLINICAL DATA:  Chest pain  EXAM: MYOCARDIAL IMAGING WITH SPECT (REST AND PHARMACOLOGIC-STRESS)  GATED LEFT VENTRICULAR WALL MOTION STUDY  LEFT VENTRICULAR EJECTION FRACTION  TECHNIQUE: Standard myocardial SPECT imaging was performed after resting intravenous injection of 10.7 mCi Tc-28m tetrofosmin. Subsequently, intravenous infusion of Lexiscan was performed under the supervision of the Cardiology staff. At peak effect of the drug, 30 mCi Tc-75m tetrofosmin was injected intravenously and standard myocardial SPECT imaging was performed. Quantitative gated imaging was also performed to evaluate left ventricular wall motion, and estimate left ventricular ejection fraction.  COMPARISON:  None.  FINDINGS: Perfusion: There is a small to moderate size region of mild reversibility involving the distal inferolateral wall.  Wall Motion: Normal left ventricular wall motion. No left ventricular dilation.  Left Ventricular Ejection Fraction: 60 %  End diastolic volume 101 ml  End systolic volume 41 ml  IMPRESSION: 1. Small to moderate size area of mild reversibility is noted within the distal inferolateral wall.  2. Normal left ventricular wall motion.  3. Left ventricular ejection fraction 60%  4. Non invasive risk stratification*: Intermediate  *2012 Appropriate Use Criteria for Coronary Revascularization Focused Update:  J Am Coll Cardiol. 2012;59(9):857-881. http://content.dementiazones.com.aspx?articleid=1201161  These results  will be called to the ordering clinician or representative by the Radiologist Assistant, and communication documented in the PACS or zVision Dashboard.   Electronically Signed By: Signa Kell M.D. On: 08/30/2019 16:46   ECHOCARDIOGRAM  ECHOCARDIOGRAM COMPLETE 12/10/2020  Narrative ECHOCARDIOGRAM REPORT    Patient Name:   Reginald Nguyen Date of Exam: 12/10/2020 Medical Rec #:  [id number]    Height:       71.5 in Accession #:    [id number]   Weight:       227.0 lb Date of Birth:  06-Aug-1960     BSA:          2.237 m Patient Age:    60 years     BP:           102/60 mmHg Patient Gender: M            HR:           60 bpm. Exam Location:  [place]  Procedure: 2D Echo  Indications:    Coronary artery disease involving native coronary artery of native heart without angina pectoris [I25.10 (ICD-10-CM)]; Chronic obstructive pulmonary disease, unspecified COPD type (HCC) [J44.9 (ICD-10-CM)]; Dyslipidemia [E78.5 (ICD-10-CM)]; Dizziness [R42 (ICD-10-CM)]; Syncope and collapse Vera.August (ICD-10-CM)]; Palpitations [R00.2 (ICD-10-CM)]  History:        Patient has prior history of Echocardiogram examinations, most recent 08/29/2019. Previous Myocardial Infarction, Chronic obstructive pulmonary disease, Signs/Symptoms:Chest Pain; Risk Factors:Current Smoker.  Sonographer:    Louie Boston Referring Phys: [phone] ROBERT J KRASOWSKI  IMPRESSIONS   1. Left ventricular ejection fraction, by estimation, is 55 to 60%. The left ventricle has normal function. The left ventricle has no regional wall motion abnormalities. There is mild concentric left ventricular hypertrophy. Left ventricular diastolic parameters are consistent with Grade I diastolic dysfunction (impaired relaxation). The average left ventricular global longitudinal strain is -8.2 %. The global longitudinal strain is abnormal. 2. Right ventricular systolic function is normal. The right ventricular size is normal. There is normal pulmonary  artery systolic pressure. 3. The mitral valve is normal in structure. No evidence of mitral valve regurgitation. No evidence of mitral stenosis. 4. The aortic valve is tricuspid. Aortic valve regurgitation is not visualized. No aortic stenosis is present. 5. Aortic normal DTA. There is moderate dilatation of the ascending aorta and of the aortic root, measuring 39 mm. 6. The inferior vena cava is normal in size with greater than 50% respiratory variability, suggesting right atrial pressure of 3 mmHg.  FINDINGS Left Ventricle: Left ventricular ejection fraction, by estimation, is 55 to 60%. The left ventricle has normal function. The left ventricle has no regional wall motion abnormalities. The average left ventricular global longitudinal strain is -8.2 %. The global longitudinal strain is abnormal. The left ventricular internal cavity size was normal in size. There is mild concentric left ventricular hypertrophy. Left ventricular diastolic parameters are consistent with Grade I diastolic dysfunction (impaired relaxation). Normal left ventricular filling pressure.  Right Ventricle: The right ventricular size is normal. No increase in right ventricular wall thickness. Right ventricular systolic function is normal. There is normal pulmonary artery systolic pressure. The tricuspid regurgitant velocity is 1.38 m/s, and with an assumed right atrial pressure of 8 mmHg, the estimated right ventricular systolic pressure is 15.6 mmHg.  Left [facility]: Left atrial size was normal in size.  Right [facility]: Right atrial size was normal in size.  Pericardium: There is no evidence of pericardial effusion.  Mitral Valve: The mitral valve is normal in structure. No evidence of mitral valve regurgitation. No evidence of mitral valve stenosis.  Tricuspid Valve: The tricuspid valve is normal in structure. Tricuspid valve regurgitation is trivial. No evidence of tricuspid stenosis.  Aortic Valve: The aortic valve is  tricuspid. Aortic valve regurgitation is not visualized. No aortic stenosis is present.  Pulmonic Valve: The pulmonic valve was normal in structure. Pulmonic valve regurgitation is not visualized. No evidence of pulmonic stenosis.  Aorta: Normal DTA and the aortic arch was not well visualized. There is moderate dilatation of the ascending aorta and of the aortic root, measuring 39 mm.  Venous: The pulmonary veins were not well visualized. The inferior vena cava is normal in size with greater than 50% respiratory variability, suggesting right atrial pressure of 3 mmHg.  IAS/Shunts: No atrial level shunt detected by color flow Doppler.   LEFT VENTRICLE PLAX 2D LVIDd:         5.00 cm  Diastology LVIDs:         3.60 cm  LV e' medial:    6.53 cm/s LV PW:         1.10 cm  LV E/e' medial:  11.4 LV IVS:        1.10 cm  LV e' lateral:   9.25 cm/s LVOT diam:     2.20 cm  LV E/e' lateral: 8.1 LV SV:         78 LV SV Index:   35       2D Longitudinal Strain LVOT Area:     3.80 cm 2D Strain GLS Avg:     -8.2 %   RIGHT VENTRICLE            IVC RV S prime:     7.18 cm/s  IVC diam: 2.00 cm TAPSE (M-mode): 2.1 cm  LEFT ATRIUM             Index       RIGHT ATRIUM           Index LA diam:        3.20 cm 1.43 cm/m  RA Area:     13.90 cm LA Vol (A2C):   44.6 ml 19.94 ml/m RA Volume:   34.30 ml  15.33 ml/m LA Vol (A4C):   39.6 ml 17.70 ml/m LA Biplane Vol: 45.1 ml 20.16 ml/m AORTIC VALVE LVOT Vmax:   92.90 cm/s LVOT Vmean:  65.300 cm/s LVOT VTI:    0.204 m  AORTA Ao Root diam: 3.90 cm Ao Asc diam:  3.90 cm Ao Desc diam: 2.10 cm  MITRAL VALVE               TRICUSPID VALVE MV Area (PHT): 3.45 cm    TR Peak grad:   7.6 mmHg MV Decel Time: 220 msec    TR Vmax:        138.00 cm/s MV E velocity: 74.60 cm/s MV A velocity: 82.70 cm/s  SHUNTS MV E/A ratio:  0.90        Systemic VTI:  0.20 m Systemic Diam: 2.20 cm  Norman Herrlich MD Electronically signed by Norman Herrlich MD Signature  Date/Time: 12/10/2020/11:53:30 AM    Final    MONITORS  LONG TERM MONITOR (3-14 DAYS) 12/04/2020  Narrative Patch Wear Time:  6 days and 8 hours (2022-05-12T10:59:48-0400 to 2022-05-18T19:01:16-0400)  Patient had a min HR of 56 bpm, max HR of 123 bpm, and avg HR of 71 bpm. Predominant underlying rhythm  was Sinus Rhythm. First Degree AV Block was present. 2 Supraventricular Tachycardia runs occurred, the run with the fastest interval lasting 5 beats with a max rate of 116 bpm, the longest lasting 6 beats with an avg rate of 107 bpm. Isolated SVEs were rare (<1.0%), SVE Couplets were rare (<1.0%), and SVE Triplets were rare (<1.0%). No Isolated VEs, VE Couplets, or VE Triplets were present.  Summary conclusions: 2 episode of supraventricular tachycardia noted. No triggered events First-degree AV block noted           Risk Assessment/Calculations:             Physical Exam:   VS:  BP 124/80 (BP Location: Left Arm, Patient Position: Sitting, Cuff Size: Normal)   Pulse 67   Ht 5\' 11"  (1.803 m)   Wt 191 lb 12.8 oz (87 kg)   SpO2 99%   BMI 26.75 kg/m    Wt Readings from Last 3 Encounters:  01/11/23 191 lb 12.8 oz (87 kg)  06/12/22 207 lb (93.9 kg)  10/24/21 219 lb 6.4 oz (99.5 kg)    GEN: Well nourished, well developed in no acute distress NECK: No JVD; No carotid bruits CARDIAC: RRR, no murmurs, rubs, gallops RESPIRATORY:  Clear to auscultation without rales, wheezing or rhonchi  ABDOMEN: Soft, non-tender, non-distended EXTREMITIES:  No edema; No deformity   ASSESSMENT AND PLAN: .   CAD -nonobstructive per LHC in 2021, Stable with no anginal symptoms. No indication for ischemic evaluation.  Continue aspirin 81 mg daily, continue Lipitor 40 mg daily, continue nitroglycerin as needed, continue fenofibrate 145 mg daily, continue metoprolol 12.5 mg twice daily.  Right leg paresthesias-sounds to be consistent with neuropathy, ABI were negative.  He is currently on gabapentin for  spinal stenosis, advised him this would be the same medication that would be recommended for neuropathy and to take as advised see if it helped with pain--he has been taken intermittently.  HTN -blood pressure today is well-controlled 124/80, continue metoprolol 12.5 mg twice daily.  HLD/medication management- LDL on 06/02/22 was elevated at 95, goal is less than 70.  Will repeat direct LDL today and CMET.  Tobacco abuse-smokes approximately 1.5 PPD, offered help with cessation however he wants to try on his own at this time.    Dispo: CMET, direct LDL today.  Return in 6 months  Signed, Flossie Dibble, NP

## 2023-01-11 ENCOUNTER — Ambulatory Visit: Payer: 59 | Attending: Cardiology | Admitting: Cardiology

## 2023-01-11 ENCOUNTER — Encounter: Payer: Self-pay | Admitting: Cardiology

## 2023-01-11 VITALS — BP 124/80 | HR 67 | Ht 71.0 in | Wt 191.8 lb

## 2023-01-11 DIAGNOSIS — IMO0001 Reserved for inherently not codable concepts without codable children: Secondary | ICD-10-CM

## 2023-01-11 DIAGNOSIS — Z79899 Other long term (current) drug therapy: Secondary | ICD-10-CM

## 2023-01-11 DIAGNOSIS — F17200 Nicotine dependence, unspecified, uncomplicated: Secondary | ICD-10-CM | POA: Diagnosis not present

## 2023-01-11 DIAGNOSIS — I2510 Atherosclerotic heart disease of native coronary artery without angina pectoris: Secondary | ICD-10-CM

## 2023-01-11 DIAGNOSIS — Z720 Tobacco use: Secondary | ICD-10-CM

## 2023-01-11 DIAGNOSIS — E785 Hyperlipidemia, unspecified: Secondary | ICD-10-CM | POA: Diagnosis not present

## 2023-01-11 DIAGNOSIS — I10 Essential (primary) hypertension: Secondary | ICD-10-CM

## 2023-01-11 DIAGNOSIS — R202 Paresthesia of skin: Secondary | ICD-10-CM

## 2023-01-11 NOTE — Patient Instructions (Signed)
Medication Instructions:  Your physician recommends that you continue on your current medications as directed. Please refer to the Current Medication list given to you today.  *If you need a refill on your cardiac medications before your next appointment, please call your pharmacy*   Lab Work: Your physician recommends that you return for lab work in: Today for CMP and Fasting Lipid Panel  If you have labs (blood work) drawn today and your tests are completely normal, you will receive your results only by: MyChart Message (if you have MyChart) OR A paper copy in the mail If you have any lab test that is abnormal or we need to change your treatment, we will call you to review the results.   Testing/Procedures: NONE   Follow-Up: At [facility], you and your health needs are our priority.  As part of our continuing mission to provide you with exceptional heart care, we have created designated Provider Care Teams.  These Care Teams include your primary Cardiologist (physician) and Advanced Practice Providers (APPs -  Physician Assistants and Nurse Practitioners) who all work together to provide you with the care you need, when you need it.  We recommend signing up for the patient portal called "MyChart".  Sign up information is provided on this After Visit Summary.  MyChart is used to connect with patients for Virtual Visits (Telemedicine).  Patients are able to view lab/test results, encounter notes, upcoming appointments, etc.  Non-urgent messages can be sent to your provider as well.   To learn more about what you can do with MyChart, go to [email or web address].    Your next appointment:   6 month(s)  Provider:   Gypsy Balsam, MD    Other Instructions

## 2023-01-12 ENCOUNTER — Telehealth: Payer: Self-pay | Admitting: *Deleted

## 2023-01-12 DIAGNOSIS — E785 Hyperlipidemia, unspecified: Secondary | ICD-10-CM

## 2023-01-12 DIAGNOSIS — Z79899 Other long term (current) drug therapy: Secondary | ICD-10-CM

## 2023-01-12 LAB — COMPREHENSIVE METABOLIC PANEL WITH GFR
ALT: 15 IU/L (ref 0–44)
AST: 18 IU/L (ref 0–40)
Albumin: 4.5 g/dL (ref 3.9–4.9)
Alkaline Phosphatase: 74 IU/L (ref 44–121)
BUN/Creatinine Ratio: 17 (ref 10–24)
BUN: 23 mg/dL (ref 8–27)
Bilirubin Total: 0.5 mg/dL (ref 0.0–1.2)
CO2: 22 mmol/L (ref 20–29)
Calcium: 9.7 mg/dL (ref 8.6–10.2)
Chloride: 104 mmol/L (ref 96–106)
Creatinine, Ser: 1.38 mg/dL — ABNORMAL HIGH (ref 0.76–1.27)
Globulin, Total: 1.8 g/dL (ref 1.5–4.5)
Glucose: 101 mg/dL — ABNORMAL HIGH (ref 70–99)
Potassium: 4 mmol/L (ref 3.5–5.2)
Sodium: 142 mmol/L (ref 134–144)
Total Protein: 6.3 g/dL (ref 6.0–8.5)
eGFR: 57 mL/min/1.73 — ABNORMAL LOW

## 2023-01-12 LAB — LIPID PANEL
Chol/HDL Ratio: 3.5 ratio (ref 0.0–5.0)
Cholesterol, Total: 163 mg/dL (ref 100–199)
HDL: 46 mg/dL
LDL Chol Calc (NIH): 88 mg/dL (ref 0–99)
Triglycerides: 169 mg/dL — ABNORMAL HIGH (ref 0–149)
VLDL Cholesterol Cal: 29 mg/dL (ref 5–40)

## 2023-01-12 MED ORDER — ATORVASTATIN CALCIUM 80 MG PO TABS: 80.0000 mg | ORAL_TABLET | Freq: Every day | ORAL | 3 refills | Status: DC

## 2023-01-12 NOTE — Telephone Encounter (Signed)
Pt agreeable with the increase in Lipitor, sent new rx to pharmacy. Let pt know to come back in 8 weeks, around Aug. 27th. Pt verbalized understanding and had no further questions.

## 2023-01-12 NOTE — Telephone Encounter (Signed)
-----   Message from Flossie Dibble, NP sent at 01/12/2023  7:35 AM EDT ----- Stable kidney function Normal electrolytes Bad cholesterol is too high, increase lipitor to 80 mg daily Return in 8 weeks for repeat LFT and FLPs

## 2023-02-11 ENCOUNTER — Encounter: Payer: 59 | Admitting: Podiatry

## 2023-02-11 NOTE — Progress Notes (Signed)
Patient was a no-show for today's scheduled appointment.

## 2023-02-17 ENCOUNTER — Ambulatory Visit: Payer: 59 | Admitting: Podiatry

## 2023-02-19 ENCOUNTER — Ambulatory Visit: Payer: 59 | Admitting: Podiatry

## 2023-02-26 ENCOUNTER — Ambulatory Visit: Payer: 59 | Admitting: Podiatry

## 2023-03-08 DIAGNOSIS — R519 Headache, unspecified: Secondary | ICD-10-CM

## 2023-03-08 HISTORY — DX: Headache, unspecified: R51.9

## 2023-03-10 ENCOUNTER — Ambulatory Visit: Payer: 59 | Admitting: Podiatry

## 2023-04-07 ENCOUNTER — Ambulatory Visit (INDEPENDENT_AMBULATORY_CARE_PROVIDER_SITE_OTHER): Payer: 59 | Admitting: Podiatry

## 2023-04-07 DIAGNOSIS — B351 Tinea unguium: Secondary | ICD-10-CM

## 2023-04-07 DIAGNOSIS — M79609 Pain in unspecified limb: Secondary | ICD-10-CM | POA: Diagnosis not present

## 2023-04-07 NOTE — Progress Notes (Signed)
Subjective:  Patient ID: Reginald Nguyen, male    DOB: 1960/11/04,  MRN: [id number]  [name] presents to clinic today for:  Chief Complaint  Patient presents with   Nail Problem    Routine Foot Care-nail trim    Patient notes nails are thick, discolored, elongated and painful in shoegear when trying to ambulate.    PCP is Plishka, [employer], PA-C.  Past Medical History:  Diagnosis Date   Acid reflux    Asthma    Barrett's esophagus    Bipolar 1 disorder (HCC) 08/29/2019   Bipolar disorder (HCC)    CAD (coronary artery disease)    CAD in native artery 02/21/2015   Formatting of this note might be different from the original. nonobstructive disease by cath 2007   Chest discomfort 03/30/2018   Chiari I malformation (HCC)    Chronic obstructive pulmonary disease (HCC) 08/29/2019   Coronary artery disease involving native coronary artery of native heart without angina pectoris 02/21/2015   Dyslipidemia    GERD (gastroesophageal reflux disease)    Heart attack (HCC)    x2   History of angina    Malaise and fatigue 08/25/2017   Manic depression (HCC)    Migraine    PTSD (post-traumatic stress disorder)    since teenager    Smoking 02/21/2015   Unstable angina (HCC) 08/29/2019   Vertigo     Past Surgical History:  Procedure Laterality Date   EYE SURGERY Bilateral    HEMORRHOID SURGERY      Allergies  Allergen Reactions   Morphine Other (See Comments) and Anaphylaxis    ask   Nisoldipine Other (See Comments)   Calcium Oxide Hives    Lemons and limes   Codeine Other (See Comments)    ask HIVES   Mushroom Extract Complex    Other     Lemons, Limes, Mushrooms  Has to have allergy shots   Sulfa Antibiotics Other (See Comments) and Diarrhea    IBS GI Upset   Review of Systems: Negative except as noted in the HPI.  Objective:  Reginald Nguyen is a pleasant 62 y.o. male in NAD. AAO x 3.  Vascular Examination: Capillary refill time is 3-5 seconds to toes  bilateral. Palpable pedal pulses b/l LE. Digital hair present b/l.  Skin temperature gradient WNL b/l. No varicosities b/l. No cyanosis noted b/l.   Dermatological Examination: Pedal skin with normal turgor, texture and tone b/l. No open wounds. No interdigital macerations b/l. Toenails x10 are 3mm thick, discolored, dystrophic with subungual debris. There is pain with compression of the nail plates.  They are elongated x10  Assessment/Plan: 1. Pain due to onychomycosis of nail     The mycotic toenails were sharply debrided x10 with sterile nail nippers and a power debriding burr to decrease bulk/thickness and length.    Return in about 3 months (around 07/08/2023) for RFC.   Clerance Lav, DPM, FACFAS [facility]     [address] [place], [place] [address]                Office [phone]  Fax [phone]

## 2023-04-24 DIAGNOSIS — J069 Acute upper respiratory infection, unspecified: Secondary | ICD-10-CM | POA: Diagnosis not present

## 2023-04-24 DIAGNOSIS — R0981 Nasal congestion: Secondary | ICD-10-CM | POA: Diagnosis not present

## 2023-05-01 DIAGNOSIS — R0981 Nasal congestion: Secondary | ICD-10-CM | POA: Diagnosis not present

## 2023-05-04 ENCOUNTER — Other Ambulatory Visit: Payer: Self-pay | Admitting: Cardiology

## 2023-05-24 DIAGNOSIS — M47812 Spondylosis without myelopathy or radiculopathy, cervical region: Secondary | ICD-10-CM | POA: Insufficient documentation

## 2023-05-24 DIAGNOSIS — M5030 Other cervical disc degeneration, unspecified cervical region: Secondary | ICD-10-CM | POA: Diagnosis not present

## 2023-05-24 DIAGNOSIS — M542 Cervicalgia: Secondary | ICD-10-CM

## 2023-05-24 HISTORY — DX: Spondylosis without myelopathy or radiculopathy, cervical region: M47.812

## 2023-05-24 HISTORY — DX: Cervicalgia: M54.2

## 2023-05-25 ENCOUNTER — Other Ambulatory Visit: Payer: Self-pay

## 2023-05-25 MED ORDER — FENOFIBRATE 145 MG PO TABS: 145.0000 mg | ORAL_TABLET | Freq: Every day | ORAL | 1 refills | Status: DC

## 2023-05-31 ENCOUNTER — Ambulatory Visit: Payer: 59 | Admitting: Student

## 2023-06-07 DIAGNOSIS — M5030 Other cervical disc degeneration, unspecified cervical region: Secondary | ICD-10-CM

## 2023-06-07 HISTORY — DX: Other cervical disc degeneration, unspecified cervical region: M50.30

## 2023-06-15 ENCOUNTER — Ambulatory Visit: Payer: 59 | Admitting: Student

## 2023-06-22 ENCOUNTER — Ambulatory Visit: Payer: 59 | Admitting: Student

## 2023-07-05 ENCOUNTER — Encounter: Payer: Self-pay | Admitting: Student

## 2023-07-05 ENCOUNTER — Ambulatory Visit: Payer: 59 | Admitting: Student

## 2023-07-05 VITALS — BP 124/78 | HR 93 | Temp 98.1°F | Resp 18 | Ht 71.0 in | Wt 219.0 lb

## 2023-07-05 DIAGNOSIS — Z7689 Persons encountering health services in other specified circumstances: Secondary | ICD-10-CM

## 2023-07-05 DIAGNOSIS — Z6830 Body mass index (BMI) 30.0-30.9, adult: Secondary | ICD-10-CM

## 2023-07-05 DIAGNOSIS — H6121 Impacted cerumen, right ear: Secondary | ICD-10-CM | POA: Insufficient documentation

## 2023-07-05 HISTORY — DX: Impacted cerumen, right ear: H61.21

## 2023-07-05 HISTORY — DX: Persons encountering health services in other specified circumstances: Z76.89

## 2023-07-05 NOTE — Assessment & Plan Note (Signed)
This is the patients first visit with [place] at [facility]. Per chart review with the patient I can verify collaborative efforts of specialists and therapist managing the patient care. Unfortunately, it is unclear as to what his desires are other than being seen more frequently than 6 months. He does requests a urologist referral for erectile dysfunction and perniones but says it is not bothering him at the moment. I have explained the pattern in which visits typically occur.

## 2023-07-05 NOTE — Assessment & Plan Note (Signed)
Ceruminosis is noted.  Wax is removed by syringing and manual debridement. Instructions for home care to prevent wax buildup are given.  

## 2023-07-05 NOTE — Patient Instructions (Signed)
Your last annual wellness was done January 19, 2023. You are not due until 01/2024.

## 2023-07-05 NOTE — Progress Notes (Signed)
New Patient Office Visit  Subjective    Patient ID: Reginald Nguyen, male    DOB: 04-20-1961  Age: 62 y.o. MRN: [id number]  CC:  Chief Complaint  Patient presents with   Annual Exam    Here to establish care. Transferring Dr. Avelino Leeds with frequency of visits at that office, was every 6 months and he wants to be seen more frequently.    HPI Reginald Nguyen presents to establish care, he was previously followed by Raliegh Ip, PA with [facility]. On the last visit 01/19/2023  an annual wellness exam was completed. He says that his wife was a former patient of Dr. Shanda Bumps and he wants to make the change. He states "I want to be seen more than every 6 months just in case something was going on, I don't to miss something." I have explained the pattern in which visits typically occur. The patient is a resident of [place] where he has 1 grown son, his sister lives with him. His past medical history Barrett's esophagus with dysplasia, nonobstructive CAD per [facility] in 2021, hypertension, COPD, bipolar II disorder, seizure, hyperlipidemia, and smoking. The patient is under the care of specialist: cardiology, neurology, podiatry, psychiatry [facility]), and orthopedist [facility]. He is seen regularly and notes only missing appointments due to transportation.  Today he expresses the need to get his ears cleaned out and asking for annual physical. His last physical was in July.  He denies ear pain, no discharge, and no ringing. In the past he has used an ear solution to help remove cerumen but caused ear discomfort.     Outpatient Encounter Medications as of 07/05/2023  Medication Sig   atorvastatin (LIPITOR) 80 MG tablet Take 1 tablet (80 mg total) by mouth daily.   doxepin (SINEQUAN) 150 MG capsule Take 150 mg by mouth at bedtime.   lamoTRIgine (LAMICTAL) 25 MG tablet Take 25 mg by mouth daily.   montelukast (SINGULAIR) 10 MG tablet Take 10 mg by mouth at bedtime.   [DISCONTINUED] buPROPion ER  (WELLBUTRIN SR) 100 MG 12 hr tablet Take 100 mg by mouth daily.   ASPIRIN 81 PO Take 162 mg by mouth in the morning and at bedtime.    busPIRone (BUSPAR) 30 MG tablet Take 50 mg by mouth daily.   ciclopirox (PENLAC) 8 % solution Apply topically at bedtime. Apply over nail and surrounding skin. Apply daily over previous coat. After seven (7) days, may remove with alcohol and continue cycle.   clonazePAM (KLONOPIN) 1 MG tablet SMARTSIG:1.5 Tablet(s) By Mouth Every Evening PRN   Cyanocobalamin (VITAMIN B12 PO) Take 1 tablet by mouth daily. Unknown strength   DULoxetine (CYMBALTA) 60 MG capsule Take 60 mg by mouth at bedtime.   EPINEPHrine (EPIPEN 2-PAK) 0.3 mg/0.3 mL IJ SOAJ injection Inject 0.3 mg into the muscle as needed for anaphylaxis.   fenofibrate micronized (LOFIBRA) 134 MG capsule Take 134 mg by mouth daily.   FLOVENT HFA 110 MCG/ACT inhaler Inhale 2 puffs into the lungs 2 (two) times daily. Use with spacer device.   gabapentin (NEURONTIN) 600 MG tablet Take 600 mg by mouth 3 (three) times daily.   ibuprofen (ADVIL) 600 MG tablet Take 600 mg by mouth.   KRILL OIL PO Take 1 capsule by mouth daily. Unknown strength   lamoTRIgine (LAMICTAL) 200 MG tablet Take 200 mg by mouth in the morning.   levocetirizine (XYZAL) 5 MG tablet Take 1 tablet (5 mg total) by mouth daily as needed.  levocetirizine (XYZAL) 5 MG tablet Take 1 tablet by mouth daily.   meloxicam (MOBIC) 7.5 MG tablet Take 7.5 mg by mouth 2 (two) times daily.   nitroGLYCERIN (NITROSTAT) 0.4 MG SL tablet Place 1 tablet (0.4 mg total) under the tongue every 5 (five) minutes as needed for chest pain.   pantoprazole (PROTONIX) 40 MG tablet Take 40 mg by mouth 2 (two) times daily.    topiramate (TOPAMAX) 50 MG tablet Take 1 tablet by mouth 2 (two) times daily.   VITAMIN D, ERGOCALCIFEROL, PO Take 1 tablet by mouth daily. Unknown strength   [DISCONTINUED] doxepin (SINEQUAN) 50 MG capsule Take 100 mg by mouth at bedtime as needed.    [DISCONTINUED] fenofibrate (TRICOR) 145 MG tablet Take 1 tablet (145 mg total) by mouth daily.   [DISCONTINUED] gabapentin (NEURONTIN) 400 MG capsule Take 400 mg by mouth 3 (three) times daily.   [DISCONTINUED] metoprolol tartrate (LOPRESSOR) 25 MG tablet Take 0.5 tablets (12.5 mg total) by mouth 2 (two) times daily.   [DISCONTINUED] TREMFYA 100 MG/ML SOPN Inject into the skin.   [DISCONTINUED] Wheat Dextrin (BENEFIBER PO) Take 1 tablet by mouth daily. Unknown strenght   No facility-administered encounter medications on file as of 07/05/2023.    Past Medical History:  Diagnosis Date   Acid reflux    Asthma    Barrett's esophagus    Bipolar 1 disorder (HCC) 08/29/2019   Bipolar disorder (HCC)    CAD (coronary artery disease)    CAD in native artery 02/21/2015   Formatting of this note might be different from the original. nonobstructive disease by cath 2007   Chest discomfort 03/30/2018   Chiari I malformation (HCC)    Chronic obstructive pulmonary disease (HCC) 08/29/2019   Coronary artery disease involving native coronary artery of native heart without angina pectoris 02/21/2015   Dyslipidemia    GERD (gastroesophageal reflux disease)    Heart attack (HCC)    x2   History of angina    Malaise and fatigue 08/25/2017   Manic depression (HCC)    Migraine    PTSD (post-traumatic stress disorder)    since teenager    Smoking 02/21/2015   Unstable angina (HCC) 08/29/2019   Vertigo     Past Surgical History:  Procedure Laterality Date   EYE SURGERY Bilateral    HEMORRHOID SURGERY      Family History  Problem Relation Age of Onset   Hypertension Father    Heart Problems Father    Other Father        kidney problems   Stroke Father    Cancer Maternal Grandmother    Cancer Maternal Grandfather    Cancer Paternal Grandmother    Cancer Paternal Grandfather    Cancer Sister        nose, cervical cancer, ear x2, breast x 2   Migraines Maternal Aunt    Other Maternal Aunt         sinus headaches    Social History   Socioeconomic History   Marital status: Married    Spouse name: Not on file   Number of children: 1   Years of education: Not on file   Highest education level: GED or equivalent  Occupational History   Occupation: disabled  Tobacco Use   Smoking status: Every Day    Current packs/day: 1.50    Average packs/day: 1.5 packs/day for 49.0 years (73.5 ttl pk-yrs)    Types: Cigarettes    Start date: 1976   Smokeless  tobacco: Never   Tobacco comments:    used to smoke cigars & pipes  Vaping Use   Vaping status: Never Used  Substance and Sexual Activity   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   Drug use: Not Currently    Types: Marijuana    Comment: once in a while for neck pain   Sexual activity: Yes  Other Topics Concern   Not on file  Social History Narrative   Lives in an apt with his wife   Right handed   Caffeine: coffee 2 cups daily, root beer occasional    Social Drivers of Corporate investment banker Strain: Not on File (11/19/2021)   Received from [employer]    Financial Resource Strain: 0  Food Insecurity: Medium Risk (01/19/2023)   Received from [facility]   Hunger Vital Sign    Worried About Running Out of Food in the Last Year: Never true    Ran Out of Food in the Last Year: Sometimes true  Transportation Needs: Not on file (01/19/2023)  Physical Activity: Not on File (09/03/2021)   Received from [employer], [place]   Physical Activity    Physical Activity: 0  Stress: Not on File (11/19/2021)   Received from [facility]   Stress    Stress: 0  Social Connections: Not on File (11/19/2021)   Received from [employer]   Social Connections    Connectedness: 0  Intimate Partner Violence: Not on file    Review of Systems  Constitutional: Negative.   HENT:  Negative for ear discharge, ear pain, hearing loss and tinnitus.   Eyes: Negative.   Respiratory: Negative.    Cardiovascular:  Negative.   Gastrointestinal: Negative.   Genitourinary: Negative.   Musculoskeletal: Negative.   Skin: Negative.   Neurological: Negative.   Endo/Heme/Allergies:  Positive for environmental allergies.  Psychiatric/Behavioral: Negative.          Objective    BP 124/78 (BP Location: Right Wrist, Patient Position: Sitting)   Pulse 93   Temp 98.1 F (36.7 C) (Temporal)   Resp 18   Ht 5\' 11"  (1.803 m)   Wt 219 lb (99.3 kg)   SpO2 97%   BMI 30.54 kg/m   Physical Exam Constitutional:      Appearance: Normal appearance. He is obese.  HENT:     Right Ear: Ear canal and external ear normal. There is impacted cerumen.     Left Ear: Tympanic membrane, ear canal and external ear normal. There is no impacted cerumen.     Nose: Nose normal.     Mouth/Throat:     Mouth: Mucous membranes are moist.     Pharynx: Oropharynx is clear. No oropharyngeal exudate.  Eyes:     Conjunctiva/sclera: Conjunctivae normal.     Pupils: Pupils are equal, round, and reactive to light.  Cardiovascular:     Rate and Rhythm: Normal rate and regular rhythm.     Pulses: Normal pulses.     Heart sounds: Normal heart sounds.  Pulmonary:     Effort: Pulmonary effort is normal.     Breath sounds: Normal breath sounds.  Abdominal:     General: Bowel sounds are normal.     Palpations: Abdomen is soft.  Musculoskeletal:        General: Normal range of motion.     Cervical back: Normal range of motion and neck supple.  Skin:    General: Skin  is warm and dry.     Capillary Refill: Capillary refill takes less than 2 seconds.  Neurological:     General: No focal deficit present.     Mental Status: He is alert and oriented to person, place, and time.  Psychiatric:        Mood and Affect: Mood normal.        Behavior: Behavior normal.         Assessment & Plan:   Problem List Items Addressed This Visit     Establishing care with new doctor, encounter for - Primary   This is the patients first  visit with [place] [facility]. Per chart review with the patient I can verify collaborative efforts of specialists and therapist managing the patient care. Unfortunately, it is unclear as to what his desires are other than being seen more frequently than 6 months. He does requests a urologist referral for erectile dysfunction and perniones but says it is not bothering him at the moment. I have explained the pattern in which visits typically occur.       Impacted cerumen of right ear   Ceruminosis is noted.  Wax is removed by syringing and manual debridement. Instructions for home care to prevent wax buildup are given.       Relevant Orders   EAR CERUMEN REMOVAL    Return if symptoms worsen or fail to improve.   Edwena Blow, NP

## 2023-07-08 ENCOUNTER — Encounter: Payer: 59 | Admitting: Podiatry

## 2023-07-08 NOTE — Progress Notes (Signed)
 Patient was a no show for his scheduled appointment this afternoon.

## 2023-07-11 NOTE — Progress Notes (Signed)
 Cardiology Office Note:  .   Date:  07/12/2023  ID:  Reginald Nguyen, DOB 1961/05/14, MRN [id number] PCP: Monty Daphne CROME, NP  [facility] Providers Cardiologist:  Lamar Fitch, MD    History of Present Illness: .    Reginald Nguyen is a 63 y.o. 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  06/30/2022 vascular ultrasound ABI was normal 03/18/2022 MPI revealed no ischemia 12/10/2020 echocardiogram EF 55 to 60%, mild concentric LVH, grade 1 DD, normal PASP, no valvular abnormalities, moderate dilatation of the aortic root 39 mm. 12/04/2020 monitor revealed average heart rate 71 bpm, predominant underlying rhythm was sinus, first-degree AV block was present, 2 episodes of SVT occurred 09/23/2019 left heart cath at [facility] revealed mild nonobstructive CAD, mid LAD 20% stenosed, proximal RCA 25% stenosed  Evaluated by Dr. Fitch on 06/12/2022, he had some complaints of tingling in his right leg, ABI was arranged and completed on 06/30/2022 which was normal.  Most recently was evaluated in July 2024, he was most bothered by paresthesia of his leg, had recently been started on gabapentin , advised to follow-up in 6 months.  He presents today for follow-up of his nonobstructive CAD.  He has been doing stable from a cardiac perspective, offers no formal complaints.  He does continue to smoke very heavily, he is not currently interested in cessation.  He is relatively sedentary.  He is still bothered by dizziness-as she states he stays well-hydrated, questions on whether we can change him of his medications around. He denies chest pain, palpitations, dyspnea, pnd, orthopnea, n, v, dizziness, syncope, edema, weight gain, or early satiety.    ROS: Review of Systems  Constitutional: Negative.   HENT: Negative.    Eyes: Negative.   Respiratory: Negative.    Cardiovascular:  Negative.   Gastrointestinal: Negative.   Genitourinary: Negative.   Musculoskeletal:  Positive for back pain and neck pain.  Skin: Negative.   Neurological:  Positive for tingling (RLL).  Endo/Heme/Allergies: Negative.   Psychiatric/Behavioral: Negative.      Studies Reviewed: .        Cardiac Studies & Procedures     STRESS TESTS  NM MYOCAR MULTI W/SPECT W 08/30/2019  Narrative CLINICAL DATA:  Chest pain  EXAM: MYOCARDIAL IMAGING WITH SPECT (REST AND PHARMACOLOGIC-STRESS)  GATED LEFT VENTRICULAR WALL MOTION STUDY  LEFT VENTRICULAR EJECTION FRACTION  TECHNIQUE: Standard myocardial SPECT imaging was performed after resting intravenous injection of 10.7 mCi Tc-14m tetrofosmin . Subsequently, intravenous infusion of Lexiscan  was performed under the supervision of the Cardiology staff. At peak effect of the drug, 30 mCi Tc-30m tetrofosmin  was injected intravenously and standard myocardial SPECT imaging was performed. Quantitative gated imaging was also performed to evaluate left ventricular wall motion, and estimate left ventricular ejection fraction.  COMPARISON:  None.  FINDINGS: Perfusion: There is a small to moderate size region of mild reversibility involving the distal inferolateral wall.  Wall Motion: Normal left ventricular wall motion. No left ventricular dilation.  Left Ventricular Ejection Fraction: 60 %  End diastolic volume 101 ml  End systolic volume 41 ml  IMPRESSION: 1. Small to moderate size area of mild reversibility is noted within the distal inferolateral wall.  2. Normal left ventricular wall motion.  3. Left ventricular ejection fraction 60%  4. Non invasive risk stratification*: Intermediate  *2012 Appropriate Use Criteria for Coronary Revascularization Focused Update: J Am Coll  Cardiol. 2012;59(9):857-881. http://content.dementiazones.com.aspx?articleid=1201161  These results will be called to the ordering clinician  or representative by the Radiologist Assistant, and communication documented in the PACS or zVision Dashboard.   Electronically Signed By: Waddell Calk M.D. On: 08/30/2019 16:46  ECHOCARDIOGRAM  ECHOCARDIOGRAM COMPLETE 12/10/2020  Narrative ECHOCARDIOGRAM REPORT    Patient Name:   Reginald Nguyen Date of Exam: 12/10/2020 Medical Rec #:  [id number]    Height:       71.5 in Accession #:    [id number]   Weight:       227.0 lb Date of Birth:  06-30-1961     BSA:          2.237 m Patient Age:    60 years     BP:           102/60 mmHg Patient Gender: M            HR:           60 bpm. Exam Location:    Procedure: 2D Echo  Indications:    Coronary artery disease involving native coronary artery of native heart without angina pectoris [I25.10 (ICD-10-CM)]; Chronic obstructive pulmonary disease, unspecified COPD type (HCC) [J44.9 (ICD-10-CM)]; Dyslipidemia [E78.5 (ICD-10-CM)]; Dizziness [R42 (ICD-10-CM)]; Syncope and collapse DISCORDIA.DIESEL (ICD-10-CM)]; Palpitations [R00.2 (ICD-10-CM)]  History:        Patient has prior history of Echocardiogram examinations, most recent 08/29/2019. Previous Myocardial Infarction, Chronic obstructive pulmonary disease, Signs/Symptoms:Chest Pain; Risk Factors:Current Smoker.  Sonographer:    Lynwood Silvas Referring Phys: [phone] ROBERT J KRASOWSKI  IMPRESSIONS   1. Left ventricular ejection fraction, by estimation, is 55 to 60%. The left ventricle has normal function. The left ventricle has no regional wall motion abnormalities. There is mild concentric left ventricular hypertrophy. Left ventricular diastolic parameters are consistent with Grade I diastolic dysfunction (impaired relaxation). The average left ventricular global longitudinal strain is -8.2 %. The global longitudinal strain is abnormal. 2. Right ventricular systolic function is normal. The right ventricular size is normal. There is normal pulmonary artery systolic pressure. 3. The mitral valve  is normal in structure. No evidence of mitral valve regurgitation. No evidence of mitral stenosis. 4. The aortic valve is tricuspid. Aortic valve regurgitation is not visualized. No aortic stenosis is present. 5. Aortic normal DTA. There is moderate dilatation of the ascending aorta and of the aortic root, measuring 39 mm. 6. The inferior vena cava is normal in size with greater than 50% respiratory variability, suggesting right atrial pressure of 3 mmHg.  FINDINGS Left Ventricle: Left ventricular ejection fraction, by estimation, is 55 to 60%. The left ventricle has normal function. The left ventricle has no regional wall motion abnormalities. The average left ventricular global longitudinal strain is -8.2 %. The global longitudinal strain is abnormal. The left ventricular internal cavity size was normal in size. There is mild concentric left ventricular hypertrophy. Left ventricular diastolic parameters are consistent with Grade I diastolic dysfunction (impaired relaxation). Normal left ventricular filling pressure.  Right Ventricle: The right ventricular size is normal. No increase in right ventricular wall thickness. Right ventricular systolic function is normal. There is normal pulmonary artery systolic pressure. The tricuspid regurgitant velocity is 1.38 m/s, and with an assumed right atrial pressure of 8 mmHg, the estimated right ventricular systolic pressure is 15.6 mmHg.  Left [facility]: Left atrial size was normal in size.  Right [facility]: Right atrial size was normal in size.  Pericardium: There is no evidence of pericardial effusion.  Mitral Valve: The  mitral valve is normal in structure. No evidence of mitral valve regurgitation. No evidence of mitral valve stenosis.  Tricuspid Valve: The tricuspid valve is normal in structure. Tricuspid valve regurgitation is trivial. No evidence of tricuspid stenosis.  Aortic Valve: The aortic valve is tricuspid. Aortic valve regurgitation is not  visualized. No aortic stenosis is present.  Pulmonic Valve: The pulmonic valve was normal in structure. Pulmonic valve regurgitation is not visualized. No evidence of pulmonic stenosis.  Aorta: Normal DTA and the aortic arch was not well visualized. There is moderate dilatation of the ascending aorta and of the aortic root, measuring 39 mm.  Venous: The pulmonary veins were not well visualized. The inferior vena cava is normal in size with greater than 50% respiratory variability, suggesting right atrial pressure of 3 mmHg.  IAS/Shunts: No atrial level shunt detected by color flow Doppler.   LEFT VENTRICLE PLAX 2D LVIDd:         5.00 cm  Diastology LVIDs:         3.60 cm  LV e' medial:    6.53 cm/s LV PW:         1.10 cm  LV E/e' medial:  11.4 LV IVS:        1.10 cm  LV e' lateral:   9.25 cm/s LVOT diam:     2.20 cm  LV E/e' lateral: 8.1 LV SV:         78 LV SV Index:   35       2D Longitudinal Strain LVOT Area:     3.80 cm 2D Strain GLS Avg:     -8.2 %   RIGHT VENTRICLE            IVC RV S prime:     7.18 cm/s  IVC diam: 2.00 cm TAPSE (M-mode): 2.1 cm  LEFT ATRIUM             Index       RIGHT ATRIUM           Index LA diam:        3.20 cm 1.43 cm/m  RA Area:     13.90 cm LA Vol (A2C):   44.6 ml 19.94 ml/m RA Volume:   34.30 ml  15.33 ml/m LA Vol (A4C):   39.6 ml 17.70 ml/m LA Biplane Vol: 45.1 ml 20.16 ml/m AORTIC VALVE LVOT Vmax:   92.90 cm/s LVOT Vmean:  65.300 cm/s LVOT VTI:    0.204 m  AORTA Ao Root diam: 3.90 cm Ao Asc diam:  3.90 cm Ao Desc diam: 2.10 cm  MITRAL VALVE               TRICUSPID VALVE MV Area (PHT): 3.45 cm    TR Peak grad:   7.6 mmHg MV Decel Time: 220 msec    TR Vmax:        138.00 cm/s MV E velocity: 74.60 cm/s MV A velocity: 82.70 cm/s  SHUNTS MV E/A ratio:  0.90        Systemic VTI:  0.20 m Systemic Diam: 2.20 cm  Redell Leiter MD Electronically signed by Redell Leiter MD Signature Date/Time: 12/10/2020/11:53:30 AM    Final    MONITORS  LONG TERM MONITOR (3-14 DAYS) 11/27/2020  Narrative Patch Wear Time:  6 days and 8 hours (2022-05-12T10:59:48-0400 to 2022-05-18T19:01:16-0400)  Patient had a min HR of 56 bpm, max HR of 123 bpm, and avg HR of 71 bpm. Predominant underlying rhythm was Sinus Rhythm. First  Degree AV Block was present. 2 Supraventricular Tachycardia runs occurred, the run with the fastest interval lasting 5 beats with a max rate of 116 bpm, the longest lasting 6 beats with an avg rate of 107 bpm. Isolated SVEs were rare (<1.0%), SVE Couplets were rare (<1.0%), and SVE Triplets were rare (<1.0%). No Isolated VEs, VE Couplets, or VE Triplets were present.  Summary conclusions: 2 episode of supraventricular tachycardia noted. No triggered events First-degree AV block noted           Risk Assessment/Calculations:             Physical Exam:   VS:  BP 110/70 (BP Location: Left Arm, Patient Position: Sitting, Cuff Size: Normal)   Pulse 62   Ht 5' 11 (1.803 m)   Wt 217 lb (98.4 kg)   SpO2 95%   BMI 30.27 kg/m    Wt Readings from Last 3 Encounters:  07/12/23 217 lb (98.4 kg)  07/05/23 219 lb (99.3 kg)  01/11/23 191 lb 12.8 oz (87 kg)    GEN: Well nourished, well developed in no acute distress NECK: No JVD; No carotid bruits CARDIAC: RRR, no murmurs, rubs, gallops RESPIRATORY:  Clear to auscultation without rales, wheezing or rhonchi  ABDOMEN: Soft, non-tender, non-distended EXTREMITIES:  No edema; No deformity   ASSESSMENT AND PLAN: .   CAD -nonobstructive per LHC in 2021, Stable with no anginal symptoms. No indication for ischemic evaluation.  Continue aspirin  81 mg daily, continue Lipitor 80 mg daily, continue nitroglycerin  as needed, continue fenofibrate  145 mg daily, continue metoprolol  12.5 mg twice daily. Heart healthy diet and regular cardiovascular exercise encouraged.    HTN -blood pressure today is well-controlled 110/70, continue metoprolol  12.5 mg twice daily.  HLD/medication  management-  goal is less than 70.  Will repeat direct LDL today and CMET.  Tobacco abuse-smokes approximately 1.5 PPD, offered help with cessation however he wants to try on his own at this time.    Dispo: CMET,CBC, direct LDL today.  Return in 3 months per his request for closer follow up.   Signed, Delon JAYSON Hoover, NP

## 2023-07-12 ENCOUNTER — Encounter: Payer: Self-pay | Admitting: Cardiology

## 2023-07-12 ENCOUNTER — Ambulatory Visit: Payer: 59 | Attending: Cardiology | Admitting: Cardiology

## 2023-07-12 ENCOUNTER — Encounter: Payer: Self-pay | Admitting: *Deleted

## 2023-07-12 VITALS — BP 110/70 | HR 62 | Ht 71.0 in | Wt 217.0 lb

## 2023-07-12 DIAGNOSIS — I10 Essential (primary) hypertension: Secondary | ICD-10-CM

## 2023-07-12 DIAGNOSIS — I2510 Atherosclerotic heart disease of native coronary artery without angina pectoris: Secondary | ICD-10-CM | POA: Diagnosis not present

## 2023-07-12 DIAGNOSIS — E785 Hyperlipidemia, unspecified: Secondary | ICD-10-CM

## 2023-07-12 DIAGNOSIS — Z716 Tobacco abuse counseling: Secondary | ICD-10-CM

## 2023-07-12 DIAGNOSIS — Z79899 Other long term (current) drug therapy: Secondary | ICD-10-CM | POA: Diagnosis not present

## 2023-07-12 MED ORDER — NITROGLYCERIN 0.4 MG SL SUBL: 0.4000 mg | SUBLINGUAL_TABLET | SUBLINGUAL | 3 refills | Status: DC | PRN

## 2023-07-12 NOTE — Patient Instructions (Signed)
 Medication Instructions:  Your physician recommends that you continue on your current medications as directed. Please refer to the Current Medication list given to you today.  *If you need a refill on your cardiac medications before your next appointment, please call your pharmacy*   Lab Work: Your physician recommends that you have labs done in the office today. Your test included  complete metabolic panel, complete blood count, and direct LDL,.  If you have labs (blood work) drawn today and your tests are completely normal, you will receive your results only by: MyChart Message (if you have MyChart) OR A paper copy in the mail If you have any lab test that is abnormal or we need to change your treatment, we will call you to review the results.   Testing/Procedures: None Ordered   Follow-Up: At [facility], you and your health needs are our priority.  As part of our continuing mission to provide you with exceptional heart care, we have created designated Provider Care Teams.  These Care Teams include your primary Cardiologist (physician) and Advanced Practice Providers (APPs -  Physician Assistants and Nurse Practitioners) who all work together to provide you with the care you need, when you need it.  We recommend signing up for the patient portal called MyChart.  Sign up information is provided on this After Visit Summary.  MyChart is used to connect with patients for Virtual Visits (Telemedicine).  Patients are able to view lab/test results, encounter notes, upcoming appointments, etc.  Non-urgent messages can be sent to your provider as well.   To learn more about what you can do with MyChart, go to [email or web address].    Your next appointment:   3 month follow up with Delon Hoover

## 2023-07-13 ENCOUNTER — Telehealth: Payer: Self-pay | Admitting: Emergency Medicine

## 2023-07-13 DIAGNOSIS — I2510 Atherosclerotic heart disease of native coronary artery without angina pectoris: Secondary | ICD-10-CM

## 2023-07-13 LAB — COMPREHENSIVE METABOLIC PANEL WITH GFR
ALT: 19 IU/L (ref 0–44)
AST: 23 IU/L (ref 0–40)
Albumin: 4.7 g/dL (ref 3.9–4.9)
Alkaline Phosphatase: 85 IU/L (ref 44–121)
BUN/Creatinine Ratio: 7 — ABNORMAL LOW (ref 10–24)
BUN: 10 mg/dL (ref 8–27)
Bilirubin Total: 0.6 mg/dL (ref 0.0–1.2)
CO2: 24 mmol/L (ref 20–29)
Calcium: 9.8 mg/dL (ref 8.6–10.2)
Chloride: 104 mmol/L (ref 96–106)
Creatinine, Ser: 1.41 mg/dL — ABNORMAL HIGH (ref 0.76–1.27)
Globulin, Total: 1.8 g/dL (ref 1.5–4.5)
Glucose: 78 mg/dL (ref 70–99)
Potassium: 4.1 mmol/L (ref 3.5–5.2)
Sodium: 143 mmol/L (ref 134–144)
Total Protein: 6.5 g/dL (ref 6.0–8.5)
eGFR: 56 mL/min/1.73 — ABNORMAL LOW

## 2023-07-13 LAB — CBC
Hematocrit: 44.9 % (ref 37.5–51.0)
Hemoglobin: 15.5 g/dL (ref 13.0–17.7)
MCH: 31.1 pg (ref 26.6–33.0)
MCHC: 34.5 g/dL (ref 31.5–35.7)
MCV: 90 fL (ref 79–97)
Platelets: 222 x10E3/uL (ref 150–450)
RBC: 4.99 x10E6/uL (ref 4.14–5.80)
RDW: 12.4 % (ref 11.6–15.4)
WBC: 6 x10E3/uL (ref 3.4–10.8)

## 2023-07-13 LAB — LDL CHOLESTEROL, DIRECT: LDL Direct: 80 mg/dL (ref 0–99)

## 2023-07-13 MED ORDER — ATORVASTATIN CALCIUM 80 MG PO TABS: 80.0000 mg | ORAL_TABLET | Freq: Every day | ORAL | 3 refills | Status: DC

## 2023-07-13 NOTE — Telephone Encounter (Signed)
 Results reviewed with pt as per [name] NP's note.  Pt verbalized understanding and had no additional questions. Rx sent to pharmacy.  Routed to PCP.

## 2023-07-13 NOTE — Telephone Encounter (Signed)
-----   Message from Flossie Dibble sent at 07/13/2023  7:51 AM EST ----- Stable electrolytes and kidney function. Cholesterol is too high, increase Atorvastatin to 80 mg daily. Repeat FLP and LFTs in 8 weeks.

## 2023-07-19 DIAGNOSIS — G8929 Other chronic pain: Secondary | ICD-10-CM | POA: Diagnosis not present

## 2023-07-19 DIAGNOSIS — M545 Low back pain, unspecified: Secondary | ICD-10-CM | POA: Diagnosis not present

## 2023-07-20 ENCOUNTER — Ambulatory Visit: Payer: 59 | Admitting: Allergy

## 2023-07-22 DIAGNOSIS — M542 Cervicalgia: Secondary | ICD-10-CM | POA: Diagnosis not present

## 2023-07-27 ENCOUNTER — Ambulatory Visit: Payer: 59 | Admitting: Allergy

## 2023-08-05 ENCOUNTER — Other Ambulatory Visit: Payer: Self-pay

## 2023-08-17 ENCOUNTER — Ambulatory Visit: Payer: 59 | Admitting: Allergy

## 2023-08-24 ENCOUNTER — Ambulatory Visit: Payer: 59 | Admitting: Allergy

## 2023-09-02 ENCOUNTER — Ambulatory Visit (INDEPENDENT_AMBULATORY_CARE_PROVIDER_SITE_OTHER): Payer: 59 | Admitting: Podiatry

## 2023-09-02 DIAGNOSIS — I739 Peripheral vascular disease, unspecified: Secondary | ICD-10-CM | POA: Diagnosis not present

## 2023-09-02 DIAGNOSIS — M79675 Pain in left toe(s): Secondary | ICD-10-CM | POA: Diagnosis not present

## 2023-09-02 DIAGNOSIS — M79674 Pain in right toe(s): Secondary | ICD-10-CM | POA: Diagnosis not present

## 2023-09-02 DIAGNOSIS — B351 Tinea unguium: Secondary | ICD-10-CM

## 2023-09-02 DIAGNOSIS — L84 Corns and callosities: Secondary | ICD-10-CM

## 2023-09-02 NOTE — Progress Notes (Signed)
 Subjective:  Patient ID: Reginald Nguyen, male    DOB: [birth year],  MRN: [id number]  Reginald Nguyen presents to clinic today for:  Chief Complaint  Patient presents with   [facility]    RFC with no callous. Not diabetic, takes ASA 81   Patient notes nails are thick and elongated, causing pain in shoe gear when ambulating.  Patient also has a callus on the left great toe joint.  PCP is Leak, Brandi L, NP.  Date last seen was 07/05/2023  Past Medical History:  Diagnosis Date   Acid reflux    Asthma    Barrett's esophagus    Bipolar 1 disorder (HCC) 08/29/2019   Bipolar disorder (HCC)    CAD (coronary artery disease)    CAD in native artery 02/21/2015   Formatting of this note might be different from the original. nonobstructive disease by cath 2007   Chest discomfort 03/30/2018   Chiari I malformation (HCC)    Chronic obstructive pulmonary disease (HCC) 08/29/2019   Coronary artery disease involving native coronary artery of native heart without angina pectoris 02/21/2015   Dyslipidemia    GERD (gastroesophageal reflux disease)    Heart attack (HCC)    x2   History of angina    Malaise and fatigue 08/25/2017   Manic depression (HCC)    Migraine    PTSD (post-traumatic stress disorder)    since teenager    Smoking 02/21/2015   Unstable angina (HCC) 08/29/2019   Vertigo     Allergies  Allergen Reactions   Bioflavonoids     Other Reaction(s): abdominal pain   Morphine Other (See Comments) and Anaphylaxis    ask   Nisoldipine Other (See Comments)   Calcium Oxide Hives    Lemons and limes   Codeine Other (See Comments)    ask HIVES   Mushroom Extract Complex (Obsolete)    Other     Lemons, Limes, Mushrooms  Has to have allergy shots   Sulfa Antibiotics Other (See Comments) and Diarrhea    IBS GI Upset    Objective:  Reginald Nguyen is a pleasant 63 y.o. male in NAD. AAO x 3.  Vascular Examination: Patient has palpable DP pulse, absent PT pulse bilateral.  Delayed capillary  refill bilateral toes.  Sparse digital hair bilateral.  Proximal to distal cooling WNL bilateral.    Dermatological Examination: Interspaces are clear with no open lesions noted bilateral.  Skin is shiny and atrophic bilateral.  Nails are 3-70mm thick, with yellowish/brown discoloration, subungual debris and distal onycholysis x10.  There is pain with compression of nails x10.  There are hyperkeratotic lesions noted plantar medial aspect of the left first MPJ.  Neurological Examination: Epicritic sensation is intact  Patient qualifies for at-risk foot care because of PVD.  Assessment/Plan: 1. Pain due to onychomycosis of toenails of both feet   2. Callus of foot   3. PVD (peripheral vascular disease) (HCC)    Mycotic nails x10 were sharply debrided with sterile nail nippers and power debriding burr to decrease bulk and length.  Hyperkeratotic lesion x 1 was shaved with #312 blade.   Return in about 3 months (around 11/30/2023) for RFC.   Clerance Lav, DPM, FACFAS [facility]     [address]  [place], [place] [address]                Office [phone]  Fax [phone]

## 2023-09-05 DIAGNOSIS — K828 Other specified diseases of gallbladder: Secondary | ICD-10-CM | POA: Diagnosis not present

## 2023-09-05 DIAGNOSIS — K8020 Calculus of gallbladder without cholecystitis without obstruction: Secondary | ICD-10-CM | POA: Diagnosis not present

## 2023-09-05 DIAGNOSIS — R1011 Right upper quadrant pain: Secondary | ICD-10-CM | POA: Diagnosis not present

## 2023-09-05 DIAGNOSIS — K8050 Calculus of bile duct without cholangitis or cholecystitis without obstruction: Secondary | ICD-10-CM | POA: Diagnosis not present

## 2023-09-05 DIAGNOSIS — R109 Unspecified abdominal pain: Secondary | ICD-10-CM | POA: Diagnosis not present

## 2023-09-05 DIAGNOSIS — R110 Nausea: Secondary | ICD-10-CM | POA: Diagnosis not present

## 2023-09-07 DIAGNOSIS — H25813 Combined forms of age-related cataract, bilateral: Secondary | ICD-10-CM | POA: Diagnosis not present

## 2023-09-15 DIAGNOSIS — R42 Dizziness and giddiness: Secondary | ICD-10-CM | POA: Diagnosis not present

## 2023-09-15 DIAGNOSIS — M79644 Pain in right finger(s): Secondary | ICD-10-CM | POA: Diagnosis not present

## 2023-09-15 DIAGNOSIS — I10 Essential (primary) hypertension: Secondary | ICD-10-CM | POA: Diagnosis not present

## 2023-09-15 DIAGNOSIS — F17200 Nicotine dependence, unspecified, uncomplicated: Secondary | ICD-10-CM | POA: Diagnosis not present

## 2023-09-15 DIAGNOSIS — G43809 Other migraine, not intractable, without status migrainosus: Secondary | ICD-10-CM | POA: Diagnosis not present

## 2023-09-17 DIAGNOSIS — M79644 Pain in right finger(s): Secondary | ICD-10-CM | POA: Diagnosis not present

## 2023-09-21 ENCOUNTER — Encounter: Payer: Self-pay | Admitting: Allergy

## 2023-09-21 ENCOUNTER — Ambulatory Visit (INDEPENDENT_AMBULATORY_CARE_PROVIDER_SITE_OTHER): Payer: 59 | Admitting: Allergy

## 2023-09-21 VITALS — BP 114/72 | HR 75 | Temp 97.7°F | Resp 18 | Ht 69.25 in | Wt 213.0 lb

## 2023-09-21 DIAGNOSIS — J4540 Moderate persistent asthma, uncomplicated: Secondary | ICD-10-CM

## 2023-09-21 DIAGNOSIS — Z91018 Allergy to other foods: Secondary | ICD-10-CM | POA: Diagnosis not present

## 2023-09-21 DIAGNOSIS — F17200 Nicotine dependence, unspecified, uncomplicated: Secondary | ICD-10-CM | POA: Diagnosis not present

## 2023-09-21 DIAGNOSIS — J3089 Other allergic rhinitis: Secondary | ICD-10-CM

## 2023-09-21 MED ORDER — ALBUTEROL SULFATE HFA 108 (90 BASE) MCG/ACT IN AERS: 2.0000 | INHALATION_SPRAY | RESPIRATORY_TRACT | 2 refills | Status: DC | PRN

## 2023-09-21 MED ORDER — BUDESONIDE 0.5 MG/2ML IN SUSP: 0.5000 mg | Freq: Two times a day (BID) | RESPIRATORY_TRACT | 3 refills | Status: DC

## 2023-09-21 NOTE — Patient Instructions (Addendum)
 Allergies     - continue avoidance measures for tree pollen     - allergen avoidance measures provided     - recommend taking Xyzal 5mg  daily.   May benefit from rotating every 6 months between Xyzal and Allegra.   Continued use of a single antihistamine can become less and less effective over time.      - use nasal saline spray to help keep nose moisturized  Asthma     - have access to albuterol inhaler 2 puffs every 4-6 hours as needed for cough/wheeze/shortness of breath/chest tightness.  May use 15-20 minutes prior to activity.   Monitor frequency of use.      - start Budesonide 0.5mg  vials 1 vial in nebulizer twice a day.  Rinse mouth after use.  This should hopefully reduce your risk of thrush.      - recommend you think about cutting back smoking.    Asthma control goals:  Full participation in all desired activities (may need albuterol before activity) Albuterol use two time or less a week on average (not counting use with activity) Cough interfering with sleep two time or less a month Oral steroids no more than once a year No hospitalizations   Smoking     - recommend you have lung cancer screening imaging study with your smoking history.  Will get this set up for you.      - as above think about cutting back your daily use.   Food avoidance     - avoiding mushroom but has tolerated accidental ingestion  Follow-up in 3-4 months or sooner if needed

## 2023-09-21 NOTE — Progress Notes (Signed)
 Follow-up Note  RE: Reginald Nguyen MRN: [id number] DOB: 05/15/1961 Date of Office Visit: 09/21/2023   History of present illness: Reginald Nguyen is a 63 y.o. male presenting today for follow-up of asthma, allergic rhinitis and history of food allergy/intolerance.  He was last seen in the office on 03/17/2022 by myself. Discussed the use of AI scribe software for clinical note transcription with the patient, who gave verbal consent to proceed.  He experiences increased wheezing, primarily at night, accompanied by a rattling sound. The wheezing is not associated with coughing or shortness of breath. He uses a ProAir inhaler as needed, but it has not been refilled since his last visit two years ago, and he believes it may be expired. He previously used Flovent with a spacer but developed thrush despite rinsing his mouth after use. He has not used a nebulizer recently but did so years ago without experiencing thrush. He smokes a pack and a half of cigarettes daily and has not attempted or consider to cut back, citing stress and family issues as barriers. He recalls quitting smoking in the past and improving his physical fitness, but eventually resumed smoking. He has a long history of smoking since age 73.  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  He has a history of allergies and has been taking Allegra, which he reports is no longer effective. He previously took Xyzal, which also became ineffective over time. He currently uses Benadryl but is aware it is not ideal for daily use. He has taken Singulair in the past but stopped due to running out of the medication.  He mentions a past allergic reaction to mushrooms but has recently consumed them without incident.  However he states this ingestion was accidental as a neighbor prepared some food that contained mushrooms and he did not know this.  He  states he will avoid mushrooms now because he does not like the taste.  He does not have an EpiPen due to cost and has not experienced any recent allergic reactions.    Review of systems: 10pt ROS negative unless noted above in HPI  Past medical/social/surgical/family history have been reviewed and are unchanged unless specifically indicated below.  No changes  Medication List: Current Outpatient Medications  Medication Sig Dispense Refill   albuterol (VENTOLIN HFA) 108 (90 Base) MCG/ACT inhaler Inhale 2 puffs into the lungs every 4 (four) hours as needed for wheezing or shortness of breath. 18 g 2   aspirin EC 81 MG tablet Take 81 mg by mouth 2 (two) times daily. Swallow whole.     atorvastatin (LIPITOR) 80 MG tablet Take 1 tablet (80 mg total) by mouth daily. 90 tablet 3   budesonide (PULMICORT) 0.5 MG/2ML nebulizer solution Take 2 mLs (0.5 mg total) by nebulization 2 (two) times daily. 120 mL 3   busPIRone (BUSPAR) 30 MG tablet Take 50 mg by mouth daily.     ciclopirox (PENLAC) 8 % solution Apply topically at bedtime. Apply over nail and surrounding skin. Apply daily over previous coat. After seven (7) days, may remove with alcohol and continue cycle. 6.6 mL 0   clonazePAM (KLONOPIN) 1 MG tablet SMARTSIG:1.5 Tablet(s) By Mouth Every Evening PRN     Cyanocobalamin (VITAMIN B12 PO) Take 1 tablet by mouth daily. Unknown strength     dicyclomine (BENTYL) 20 MG tablet Take 20  mg by mouth every 6 (six) hours.     doxepin (SINEQUAN) 150 MG capsule Take 150 mg by mouth at bedtime.     fenofibrate (TRICOR) 145 MG tablet Take 145 mg by mouth daily.     fenofibrate micronized (LOFIBRA) 134 MG capsule Take 134 mg by mouth daily.     gabapentin (NEURONTIN) 600 MG tablet Take 600 mg by mouth 3 (three) times daily.     KRILL OIL PO Take 1 capsule by mouth daily. Unknown strength     lamoTRIgine (LAMICTAL) 200 MG tablet Take 200 mg by mouth in the morning.     meclizine (ANTIVERT) 25 MG tablet TAKE  ONE TABLET BY MOUTH EVERY 6 TO 8 HOURS AS NEEDED DIZZINESS; NEEDS APPOINTMENT     meloxicam (MOBIC) 7.5 MG tablet Take 7.5 mg by mouth 2 (two) times daily.     metoprolol tartrate (LOPRESSOR) 25 MG tablet Take 12.5 mg by mouth 2 (two) times daily.     nitroGLYCERIN (NITROSTAT) 0.4 MG SL tablet Place 1 tablet (0.4 mg total) under the tongue every 5 (five) minutes as needed for chest pain. 25 tablet 3   ondansetron (ZOFRAN-ODT) 4 MG disintegrating tablet Take 4 mg by mouth every 6 (six) hours as needed.     pantoprazole (PROTONIX) 40 MG tablet Take 40 mg by mouth 2 (two) times daily.      PROZAC 20 MG capsule Take 20 mg by mouth daily.     topiramate (TOPAMAX) 50 MG tablet Take 1 tablet by mouth 2 (two) times daily.     TREMFYA 100 MG/ML pen      triamcinolone cream (KENALOG) 0.1 % Apply 1 Application topically daily as needed (rash).     VITAMIN D, ERGOCALCIFEROL, PO Take 1 tablet by mouth daily. Unknown strength     Vitamin E 670 MG (1000 UT) CAPS Take 1,000 mg by mouth daily.     No current facility-administered medications for this visit.     Known medication allergies: Allergies  Allergen Reactions   Bioflavonoids     Other Reaction(s): abdominal pain   Morphine Other (See Comments) and Anaphylaxis    ask   Nisoldipine Other (See Comments)   Calcium Oxide Hives    Lemons and limes   Codeine Other (See Comments)    ask HIVES   Mushroom Extract Complex (Obsolete)    Other     Lemons, Limes, Mushrooms  Has to have allergy shots   Sulfa Antibiotics Other (See Comments) and Diarrhea    IBS GI Upset     Physical examination: Blood pressure 114/72, pulse 75, temperature 97.7 F (36.5 C), temperature source Temporal, resp. rate 18, height 5' 9.25" (1.759 m), weight 213 lb (96.6 kg), SpO2 96%.  General: Alert, interactive, in no acute distress. HEENT: PERRLA, TMs pearly gray, turbinates moderately edematous without discharge, post-pharynx non erythematous. Neck: Supple without  lymphadenopathy. Lungs: Mildly decreased breath sounds bilaterally without wheezing, rhonchi or rales. {no increased work of breathing. CV: Normal S1, S2 without murmurs. Abdomen: Nondistended, nontender. Skin: Warm and dry, without lesions or rashes. Extremities:  No clubbing, cyanosis or edema. Neuro:   Grossly intact.  Diagnositics/Labs: Spirometry: FEV1: 2.62L 79%, FVC: 3.42L 79%, ratio consistent with essentially nonobstructive pattern  Assessment and plan: Allergic rhinitis     - continue avoidance measures for tree pollen     - allergen avoidance measures provided     - recommend taking Xyzal 5mg  daily.   May benefit from rotating every  6 months between Xyzal and Allegra.   Continued use of a single antihistamine can become less and less effective over time.      - use nasal saline spray to help keep nose moisturized  Asthma     - have access to albuterol inhaler 2 puffs every 4-6 hours as needed for cough/wheeze/shortness of breath/chest tightness.  May use 15-20 minutes prior to activity.   Monitor frequency of use.      - start Budesonide 0.5mg  vials 1 vial in nebulizer twice a day.  Rinse mouth after use.  This should hopefully reduce your risk of thrush.      - recommend you think about cutting back smoking.    Asthma control goals:  Full participation in all desired activities (may need albuterol before activity) Albuterol use two time or less a week on average (not counting use with activity) Cough interfering with sleep two time or less a month Oral steroids no more than once a year No hospitalizations  Smoking history, current     - recommend you have lung cancer screening imaging study with your smoking history.  Will get this set up for you.      - as above think about cutting back your daily use.   Food avoidance     - avoiding mushroom but has tolerated accidental ingestion  Follow-up in 3-4 months or sooner if needed  I appreciate the opportunity to take  part in Reginald Nguyen's care. Please do not hesitate to contact me with questions.  Sincerely,   Margo Aye, MD Allergy/Immunology [facility]

## 2023-09-23 ENCOUNTER — Telehealth: Payer: Self-pay | Admitting: *Deleted

## 2023-09-23 NOTE — Telephone Encounter (Signed)
 Checked pt insurance and Pre cert not required for CT Lung Screening- procedure code 09811. Reference # for call [id number]. Placed order for [facility] [address], [place], [place] [address]. Tried scheduling had to leave a voicemail for a return call from either [place] or [place]. Left a message for [place] to return my call.

## 2023-09-23 NOTE — Telephone Encounter (Signed)
 Pt has been scheduled for Wednesday 26 th at 9:00 am. No restrictions prior to imaging.   Sent pt a [facility].

## 2023-09-23 NOTE — Addendum Note (Signed)
 Addended by: Maryjean Morn D on: 09/23/2023 11:44 AM   Modules accepted: Orders

## 2023-09-29 ENCOUNTER — Ambulatory Visit (HOSPITAL_BASED_OUTPATIENT_CLINIC_OR_DEPARTMENT_OTHER): Admitting: Radiology

## 2023-10-05 DIAGNOSIS — D2239 Melanocytic nevi of other parts of face: Secondary | ICD-10-CM | POA: Diagnosis not present

## 2023-10-05 DIAGNOSIS — D224 Melanocytic nevi of scalp and neck: Secondary | ICD-10-CM | POA: Diagnosis not present

## 2023-10-05 DIAGNOSIS — R531 Weakness: Secondary | ICD-10-CM | POA: Diagnosis not present

## 2023-10-05 DIAGNOSIS — L400 Psoriasis vulgaris: Secondary | ICD-10-CM | POA: Diagnosis not present

## 2023-10-27 ENCOUNTER — Other Ambulatory Visit: Payer: Self-pay | Admitting: Cardiology

## 2023-10-27 NOTE — Telephone Encounter (Signed)
 Prescription sent to pharmacy.

## 2023-11-09 ENCOUNTER — Telehealth: Payer: Self-pay | Admitting: *Deleted

## 2023-11-09 MED ORDER — FENOFIBRATE 145 MG PO TABS: 145.0000 mg | ORAL_TABLET | Freq: Every day | ORAL | 1 refills | Status: DC

## 2023-11-09 NOTE — Telephone Encounter (Signed)
 Received request from [facility] for Fenofibrate  145 mg and noticed that pt was over due for appt with [facility] and for blood work. Scheduled pt to see [facility] and sent in Rx.

## 2023-11-18 ENCOUNTER — Ambulatory Visit: Admitting: Cardiology

## 2023-11-19 ENCOUNTER — Other Ambulatory Visit: Payer: Self-pay

## 2023-11-19 DIAGNOSIS — R42 Dizziness and giddiness: Secondary | ICD-10-CM | POA: Insufficient documentation

## 2023-11-22 DIAGNOSIS — G43809 Other migraine, not intractable, without status migrainosus: Secondary | ICD-10-CM | POA: Diagnosis not present

## 2023-11-22 NOTE — Progress Notes (Signed)
 Cardiology Office Note:  .   Date:  11/23/2023  ID:  Reginald Nguyen, DOB [birth year], MRN [id number] PCP: Colene Dauphin, MD  [facility] Providers Cardiologist:  Ralene Burger, MD    History of Present Illness: .    Reginald Nguyen is a 63 y.o. 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  06/30/2022 vascular ultrasound ABI was normal 03/18/2022 MPI revealed no ischemia 12/10/2020 echocardiogram EF 55 to 60%, mild concentric LVH, grade 1 DD, normal PASP, no valvular abnormalities, moderate dilatation of the aortic root 39 mm. 12/04/2020 monitor revealed average heart rate 71 bpm, predominant underlying rhythm was sinus, first-degree AV block was present, 2 episodes of SVT occurred 09/23/2019 left heart cath at [facility] revealed mild nonobstructive CAD, mid LAD 20% stenosed, proximal RCA 25% stenosed  Previously established with [facility], he had had a left heart cath in 2007 revealing nonobstructive CAD.  Repeat left heart cath in 2021 revealing mild nonobstructive CAD, no intervention.  He then reestablished with Dr. Krasowski in 2022 for management of his CAD.  Most recently has been evaluated by myself on 07/12/2023, stable from a cardiac perspective, we optimized his lipid-lowering regimen, plans to follow-up in 3 months per his request.  He presents today for follow-up of his CAD.  He said over the last few days he has had episodes that have been concerning for him, states he will feel that his heart is " jumping out of rhythm", this sensation usually only last for a second, it was interestingly enough relieved by him elevating his arm.  He is not having any episodes of chest discomfort as he did in the past.  He stays active around his home although he does not participate in any formal exercise.  He continues to smoke approximately 1 and half packs per  day, he states he has tried numerous modalities to try and stop smoking, and this has been a struggle for him. He denies chest pain, dyspnea, pnd, orthopnea, n, v, dizziness, syncope, edema, weight gain, or early satiety.   ROS: Review of Systems  Constitutional: Negative.   HENT: Negative.    Eyes: Negative.   Respiratory: Negative.    Cardiovascular:  Positive for palpitations.  Gastrointestinal: Negative.   Genitourinary: Negative.   Musculoskeletal:  Positive for back pain and neck pain.  Skin: Negative.   Endo/Heme/Allergies: Negative.   Psychiatric/Behavioral: Negative.      Studies Reviewed: .        Cardiac Studies & Procedures   ______________________________________________________________________________________________   STRESS TESTS  NM MYOCAR MULTI W/SPECT W 08/30/2019  Narrative CLINICAL DATA:  Chest pain  EXAM: MYOCARDIAL IMAGING WITH SPECT (REST AND PHARMACOLOGIC-STRESS)  GATED LEFT VENTRICULAR WALL MOTION STUDY  LEFT VENTRICULAR EJECTION FRACTION  TECHNIQUE: Standard myocardial SPECT imaging was performed after resting intravenous injection of 10.7 mCi Tc-13m tetrofosmin . Subsequently, intravenous infusion of Lexiscan  was performed under the supervision of the Cardiology staff. At peak effect of the drug, 30 mCi Tc-30m tetrofosmin  was injected intravenously and standard myocardial SPECT imaging was performed. Quantitative gated imaging was also performed to evaluate left ventricular wall motion, and estimate left ventricular ejection fraction.  COMPARISON:  None.  FINDINGS: Perfusion: There is a small to moderate size region of mild reversibility involving the distal inferolateral wall.  Wall Motion: Normal left ventricular wall motion. No left ventricular dilation.  Left Ventricular Ejection Fraction:  60 %  End diastolic volume 101 ml  End systolic volume 41 ml  IMPRESSION: 1. Small to moderate size area of mild reversibility is noted  within the distal inferolateral wall.  2. Normal left ventricular wall motion.  3. Left ventricular ejection fraction 60%  4. Non invasive risk stratification*: Intermediate  *2012 Appropriate Use Criteria for Coronary Revascularization Focused Update: J Am Coll Cardiol. 2012;59(9):857-881. http://content.dementiazones.com.aspx?articleid=1201161  These results will be called to the ordering clinician or representative by the Radiologist Assistant, and communication documented in the PACS or zVision Dashboard.   Electronically Signed By: Kimberley Penman M.D. On: 08/30/2019 16:46   ECHOCARDIOGRAM  ECHOCARDIOGRAM COMPLETE 12/10/2020  Narrative ECHOCARDIOGRAM REPORT    Patient Name:   Reginald Nguyen Date of Exam: 12/10/2020 Medical Rec #:  [id number]    Height:       71.5 in Accession #:    [id number]   Weight:       227.0 lb Date of Birth:  [birth year]     BSA:          2.237 m Patient Age:    60 years     BP:           102/60 mmHg Patient Gender: M            HR:           60 bpm. Exam Location:  [facility]  Procedure: 2D Echo  Indications:    Coronary artery disease involving native coronary artery of native heart without angina pectoris [I25.10 (ICD-10-CM)]; Chronic obstructive pulmonary disease, unspecified COPD type (HCC) [J44.9 (ICD-10-CM)]; Dyslipidemia [E78.5 (ICD-10-CM)]; Dizziness [R42 (ICD-10-CM)]; Syncope and collapse Discordia.Diesel (ICD-10-CM)]; Palpitations [R00.2 (ICD-10-CM)]  History:        Patient has prior history of Echocardiogram examinations, most recent 08/29/2019. Previous Myocardial Infarction, Chronic obstructive pulmonary disease, Signs/Symptoms:Chest Pain; Risk Factors:Current Smoker.  Sonographer:    Kristen Petri Referring Phys: [phone] ROBERT J KRASOWSKI  IMPRESSIONS   1. Left ventricular ejection fraction, by estimation, is 55 to 60%. The left ventricle has normal function. The left ventricle has no regional wall motion abnormalities. There is mild  concentric left ventricular hypertrophy. Left ventricular diastolic parameters are consistent with Grade I diastolic dysfunction (impaired relaxation). The average left ventricular global longitudinal strain is -8.2 %. The global longitudinal strain is abnormal. 2. Right ventricular systolic function is normal. The right ventricular size is normal. There is normal pulmonary artery systolic pressure. 3. The mitral valve is normal in structure. No evidence of mitral valve regurgitation. No evidence of mitral stenosis. 4. The aortic valve is tricuspid. Aortic valve regurgitation is not visualized. No aortic stenosis is present. 5. Aortic normal DTA. There is moderate dilatation of the ascending aorta and of the aortic root, measuring 39 mm. 6. The inferior vena cava is normal in size with greater than 50% respiratory variability, suggesting right atrial pressure of 3 mmHg.  FINDINGS Left Ventricle: Left ventricular ejection fraction, by estimation, is 55 to 60%. The left ventricle has normal function. The left ventricle has no regional wall motion abnormalities. The average left ventricular global longitudinal strain is -8.2 %. The global longitudinal strain is abnormal. The left ventricular internal cavity size was normal in size. There is mild concentric left ventricular hypertrophy. Left ventricular diastolic parameters are consistent with Grade I diastolic dysfunction (impaired relaxation). Normal left ventricular filling pressure.  Right Ventricle: The right ventricular size is normal. No increase in right ventricular wall thickness. Right ventricular systolic function is  normal. There is normal pulmonary artery systolic pressure. The tricuspid regurgitant velocity is 1.38 m/s, and with an assumed right atrial pressure of 8 mmHg, the estimated right ventricular systolic pressure is 15.6 mmHg.  Left [facility]: Left atrial size was normal in size.  Right [facility]: Right atrial size was normal in  size.  Pericardium: There is no evidence of pericardial effusion.  Mitral Valve: The mitral valve is normal in structure. No evidence of mitral valve regurgitation. No evidence of mitral valve stenosis.  Tricuspid Valve: The tricuspid valve is normal in structure. Tricuspid valve regurgitation is trivial. No evidence of tricuspid stenosis.  Aortic Valve: The aortic valve is tricuspid. Aortic valve regurgitation is not visualized. No aortic stenosis is present.  Pulmonic Valve: The pulmonic valve was normal in structure. Pulmonic valve regurgitation is not visualized. No evidence of pulmonic stenosis.  Aorta: Normal DTA and the aortic arch was not well visualized. There is moderate dilatation of the ascending aorta and of the aortic root, measuring 39 mm.  Venous: The pulmonary veins were not well visualized. The inferior vena cava is normal in size with greater than 50% respiratory variability, suggesting right atrial pressure of 3 mmHg.  IAS/Shunts: No atrial level shunt detected by color flow Doppler.   LEFT VENTRICLE PLAX 2D LVIDd:         5.00 cm  Diastology LVIDs:         3.60 cm  LV e' medial:    6.53 cm/s LV PW:         1.10 cm  LV E/e' medial:  11.4 LV IVS:        1.10 cm  LV e' lateral:   9.25 cm/s LVOT diam:     2.20 cm  LV E/e' lateral: 8.1 LV SV:         78 LV SV Index:   35       2D Longitudinal Strain LVOT Area:     3.80 cm 2D Strain GLS Avg:     -8.2 %   RIGHT VENTRICLE            IVC RV S prime:     7.18 cm/s  IVC diam: 2.00 cm TAPSE (M-mode): 2.1 cm  LEFT ATRIUM             Index       RIGHT ATRIUM           Index LA diam:        3.20 cm 1.43 cm/m  RA Area:     13.90 cm LA Vol (A2C):   44.6 ml 19.94 ml/m RA Volume:   34.30 ml  15.33 ml/m LA Vol (A4C):   39.6 ml 17.70 ml/m LA Biplane Vol: 45.1 ml 20.16 ml/m AORTIC VALVE LVOT Vmax:   92.90 cm/s LVOT Vmean:  65.300 cm/s LVOT VTI:    0.204 m  AORTA Ao Root diam: 3.90 cm Ao Asc diam:  3.90 cm Ao Desc  diam: 2.10 cm  MITRAL VALVE               TRICUSPID VALVE MV Area (PHT): 3.45 cm    TR Peak grad:   7.6 mmHg MV Decel Time: 220 msec    TR Vmax:        138.00 cm/s MV E velocity: 74.60 cm/s MV A velocity: 82.70 cm/s  SHUNTS MV E/A ratio:  0.90        Systemic VTI:  0.20 m Systemic Diam: 2.20 cm  Polly Brink  Munley MD Electronically signed by Zoe Hinds MD Signature Date/Time: 12/10/2020/11:53:30 AM    Final    MONITORS  LONG TERM MONITOR (3-14 DAYS) 11/27/2020  Narrative Patch Wear Time:  6 days and 8 hours (2022-05-12T10:59:48-0400 to 2022-05-18T19:01:16-0400)  Patient had a min HR of 56 bpm, max HR of 123 bpm, and avg HR of 71 bpm. Predominant underlying rhythm was Sinus Rhythm. First Degree AV Block was present. 2 Supraventricular Tachycardia runs occurred, the run with the fastest interval lasting 5 beats with a max rate of 116 bpm, the longest lasting 6 beats with an avg rate of 107 bpm. Isolated SVEs were rare (<1.0%), SVE Couplets were rare (<1.0%), and SVE Triplets were rare (<1.0%). No Isolated VEs, VE Couplets, or VE Triplets were present.  Summary conclusions: 2 episode of supraventricular tachycardia noted. No triggered events First-degree AV block noted       ______________________________________________________________________________________________      Risk Assessment/Calculations:             Physical Exam:   VS:  BP 128/72   Pulse 76   Ht 5\' 11"  (1.803 m)   Wt 213 lb 6.4 oz (96.8 kg)   SpO2 94%   BMI 29.76 kg/m    Wt Readings from Last 3 Encounters:  11/23/23 213 lb 6.4 oz (96.8 kg)  09/21/23 213 lb (96.6 kg)  07/12/23 217 lb (98.4 kg)    GEN: Well nourished, well developed in no acute distress NECK: No JVD; No carotid bruits CARDIAC: RRR, no murmurs, rubs, gallops RESPIRATORY:  Clear to auscultation without rales, wheezing or rhonchi  ABDOMEN: Soft, non-tender, non-distended EXTREMITIES:  No edema; No deformity   ASSESSMENT AND PLAN: .    CAD -nonobstructive per LHC in 2021, Stable with no anginal symptoms. No indication for ischemic evaluation.  Continue aspirin  81 mg daily, continue Lipitor 80 mg daily, continue nitroglycerin  as needed, continue fenofibrate  145 mg daily, continue metoprolol  12.5 mg twice daily. Heart healthy diet and regular cardiovascular exercise encouraged.    HTN -blood pressure today is well-controlled at 120/72, continue metoprolol  12.5 mg twice daily.  Palpitations-will arrange for 2-week monitor for contributory causes.  HLD/medication management-FLP and LFTs obtained today, currently he is on Lipitor 80 mg daily as well as fenofibrate  145 mg daily.  Tobacco abuse-this is clearly been difficult for him to stop smoking, he currently smokes approximately 1.5 PPD, offered help with cessation however he wants to try on his own at this time.    Dispo: Monitor for palpitations, follow-up in 3 months per his request.  Signed, Terrance Ferretti, NP

## 2023-11-23 ENCOUNTER — Ambulatory Visit

## 2023-11-23 ENCOUNTER — Telehealth: Payer: Self-pay

## 2023-11-23 ENCOUNTER — Encounter: Payer: Self-pay | Admitting: Cardiology

## 2023-11-23 ENCOUNTER — Ambulatory Visit: Attending: Cardiology | Admitting: Cardiology

## 2023-11-23 VITALS — BP 128/72 | HR 76 | Ht 71.0 in | Wt 213.4 lb

## 2023-11-23 DIAGNOSIS — R39198 Other difficulties with micturition: Secondary | ICD-10-CM | POA: Diagnosis not present

## 2023-11-23 DIAGNOSIS — Z720 Tobacco use: Secondary | ICD-10-CM | POA: Diagnosis not present

## 2023-11-23 DIAGNOSIS — R002 Palpitations: Secondary | ICD-10-CM

## 2023-11-23 DIAGNOSIS — I2510 Atherosclerotic heart disease of native coronary artery without angina pectoris: Secondary | ICD-10-CM

## 2023-11-23 DIAGNOSIS — E785 Hyperlipidemia, unspecified: Secondary | ICD-10-CM | POA: Diagnosis not present

## 2023-11-23 DIAGNOSIS — I10 Essential (primary) hypertension: Secondary | ICD-10-CM | POA: Diagnosis not present

## 2023-11-23 MED ORDER — NITROGLYCERIN 0.4 MG SL SUBL: 0.4000 mg | SUBLINGUAL_TABLET | SUBLINGUAL | 11 refills | Status: DC | PRN

## 2023-11-23 NOTE — Telephone Encounter (Signed)
 PSA ordered per Dr. Krasowski

## 2023-11-23 NOTE — Patient Instructions (Signed)
 Medication Instructions:  Your physician recommends that you continue on your current medications as directed. Please refer to the Current Medication list given to you today.  *If you need a refill on your cardiac medications before your next appointment, please call your pharmacy*  Lab Work: NONE If you have labs (blood work) drawn today and your tests are completely normal, you will receive your results only by: MyChart Message (if you have MyChart) OR A paper copy in the mail If you have any lab test that is abnormal or we need to change your treatment, we will call you to review the results.  Testing/Procedures: You have been asked to wear a Zio Heart Monitor today. It is to be worn for 14 days. Please remove the monitor on 6/3 and mail back in the box provided.  If you have any questions about the monitor please call the company at [phone]    Follow-Up: At [facility], you and your health needs are our priority.  As part of our continuing mission to provide you with exceptional heart care, our providers are all part of one team.  This team includes your primary Cardiologist (physician) and Advanced Practice Providers or APPs (Physician Assistants and Nurse Practitioners) who all work together to provide you with the care you need, when you need it.  Your next appointment:   3 month(s)  Provider:   Pattricia Bores, NP Georgeana Kindler)    We recommend signing up for the patient portal called "MyChart".  Sign up information is provided on this After Visit Summary.  MyChart is used to connect with patients for Virtual Visits (Telemedicine).  Patients are able to view lab/test results, encounter notes, upcoming appointments, etc.  Non-urgent messages can be sent to your provider as well.   To learn more about what you can do with MyChart, go to [email or web address].   Other Instructions

## 2023-11-24 ENCOUNTER — Ambulatory Visit: Payer: Self-pay | Admitting: Cardiology

## 2023-11-24 LAB — HEPATIC FUNCTION PANEL
ALT: 25 IU/L (ref 0–44)
AST: 25 IU/L (ref 0–40)
Albumin: 4.5 g/dL (ref 3.9–4.9)
Alkaline Phosphatase: 91 IU/L (ref 44–121)
Bilirubin Total: 0.7 mg/dL (ref 0.0–1.2)
Bilirubin, Direct: 0.29 mg/dL (ref 0.00–0.40)
Total Protein: 6.1 g/dL (ref 6.0–8.5)

## 2023-11-24 LAB — LIPID PANEL
Chol/HDL Ratio: 3.4 ratio (ref 0.0–5.0)
Cholesterol, Total: 146 mg/dL (ref 100–199)
HDL: 43 mg/dL
LDL Chol Calc (NIH): 70 mg/dL (ref 0–99)
Triglycerides: 195 mg/dL — ABNORMAL HIGH (ref 0–149)
VLDL Cholesterol Cal: 33 mg/dL (ref 5–40)

## 2023-11-24 LAB — PSA: Prostate Specific Ag, Serum: 1.8 ng/mL (ref 0.0–4.0)

## 2023-11-26 ENCOUNTER — Ambulatory Visit: Payer: Self-pay | Admitting: Cardiology

## 2023-12-01 ENCOUNTER — Ambulatory Visit (INDEPENDENT_AMBULATORY_CARE_PROVIDER_SITE_OTHER): Payer: 59 | Admitting: Podiatry

## 2023-12-01 ENCOUNTER — Telehealth: Payer: Self-pay | Admitting: Cardiology

## 2023-12-01 DIAGNOSIS — B351 Tinea unguium: Secondary | ICD-10-CM | POA: Diagnosis not present

## 2023-12-01 DIAGNOSIS — M79675 Pain in left toe(s): Secondary | ICD-10-CM | POA: Diagnosis not present

## 2023-12-01 DIAGNOSIS — M79674 Pain in right toe(s): Secondary | ICD-10-CM

## 2023-12-01 DIAGNOSIS — Z720 Tobacco use: Secondary | ICD-10-CM

## 2023-12-01 DIAGNOSIS — I739 Peripheral vascular disease, unspecified: Secondary | ICD-10-CM | POA: Diagnosis not present

## 2023-12-01 NOTE — Telephone Encounter (Signed)
 Patient stated he received notification regarding Zio monitor and noted he has already completed this test and has mailed out the equipment on Saturday.    Patient stated he wants to quit smoking and wants to know what is the highest mg dosage of patch or lozenges he can get that his insurance will pay for.  Patient wants to get a two month supply of this medication.

## 2023-12-01 NOTE — Telephone Encounter (Signed)
 Advised that Pattricia Bores, NP is out of the office until 12/08/23 and at that time will review the monitor and message. Pt verbalized understanding and had no additional questions.

## 2023-12-01 NOTE — Progress Notes (Signed)
 Subjective:  Patient ID: Reginald Nguyen, male    DOB: 1961/07/04,  MRN: [id number]  Reginald Nguyen presents to clinic today for:  Chief Complaint  Patient presents with   [facility]    RFC with out callous. Not diabetic and takes ASA 81   Patient notes nails are thick and elongated, causing pain in shoe gear when ambulating.  Patient states he wanted to "get on me" for not burring his nails at the last visit.  Our documentation does show that the nails were burred to decrease thickness and smooth out the nails on his last visit.  He insists they were not.  Also reminded patient that the use of the bur is typically performed as a courtesy and is not a separately billable service anyway.  I opted not to continue this debate with the patient, so we proceeded with the visit today.  PCP is Colene Dauphin, MD. last seen around 10/14/2023  Past Medical History:  Diagnosis Date   Acid reflux    Asthma    Barrett's esophagus    Bipolar 1 disorder (HCC) 08/29/2019   Bipolar disorder (HCC)    CAD (coronary artery disease)    CAD in native artery 02/21/2015   Formatting of this note might be different from the original. nonobstructive disease by cath 2007   Cervical arthritis 05/24/2023   Chest discomfort 03/30/2018   Chiari I malformation (HCC)    Chronic kidney disease, stage 2 (mild) 07/19/2019   Chronic obstructive pulmonary disease (HCC) 08/29/2019   Coronary artery disease involving native coronary artery of native heart without angina pectoris 02/21/2015   DDD (degenerative disc disease), cervical 06/07/2023   Dizziness    Dyslipidemia    Essential (primary) hypertension 10/15/2020   Establishing care with new doctor, encounter for 07/05/2023   GERD (gastroesophageal reflux disease)    Headache disorder 03/08/2023   Heart attack (HCC)    x2   History of angina    Impacted cerumen of right ear 07/05/2023   Malaise and fatigue 08/25/2017   Male erectile dysfunction, unspecified 04/05/2019    Manic depression (HCC)    Migraine    Mixed hyperlipidemia 11/16/2018   Neck pain 05/24/2023   Other obesity due to excess calories 10/15/2020   PTSD (post-traumatic stress disorder)    since teenager    Restless legs syndrome 03/16/2017   Smoking 02/21/2015   Syncope and collapse 11/14/2020   Unstable angina (HCC) 08/29/2019   Vertigo    Vitamin D deficiency, unspecified 02/19/2020   Allergies  Allergen Reactions   Bioflavonoids     Other Reaction(s): abdominal pain   Morphine Other (See Comments) and Anaphylaxis    ask   Nisoldipine Other (See Comments)   Calcium  Oxide Hives    Lemons and limes   Codeine Other (See Comments)    ask HIVES   Mushroom Extract Complex (Obsolete)    Other     Lemons, Limes, Mushrooms  Has to have allergy  shots   Sulfa Antibiotics Other (See Comments) and Diarrhea    IBS GI Upset    Objective:  Reginald Nguyen is a pleasant 63 y.o. male in NAD. AAO x 3.  Vascular Examination: Patient has palpable DP pulse, absent PT pulse bilateral.  Delayed capillary refill bilateral toes.  Sparse digital hair bilateral.  Proximal to distal cooling WNL bilateral.    Dermatological Examination: Interspaces are clear with no open lesions noted bilateral.  Skin is shiny and atrophic bilateral.  Nails are 3-51mm thick, with yellowish/brown discoloration, subungual debris and distal onycholysis x10.  There is pain with compression of nails x10.   Patient qualifies for at-risk foot care because of PVD.  Assessment/Plan: 1. Pain due to onychomycosis of toenails of both feet   2. PVD (peripheral vascular disease) (HCC)     Mycotic nails x10 were sharply debrided with sterile nail nippers and power debriding burr to decrease bulk and length.  Return in about 3 months (around 03/02/2024) for RFC.   Joe Murders, DPM, FACFAS [facility]     [address] [place], [place] [address]                 Office [phone]  Fax [phone]

## 2023-12-02 DIAGNOSIS — R002 Palpitations: Secondary | ICD-10-CM | POA: Diagnosis not present

## 2023-12-02 NOTE — Telephone Encounter (Signed)
 Monitor preliminary report reviewed. it is still pending MD review but if new or concerning findings based on MD review, we will let him know. Monitor revealed predominantly normal sinus rhythm. early beats in the top chamber of the heart called PAC 4.6% of the time which are very common and not dangerous. No significant arrhythmias. Triggered episodes (when he pressed the button) were associated with normal sinus rhythm or an occasional early beat. no dangerous arrhythmias.   If still bothered by palpitations, can increase Metoprolol  tartrate to one tablet BID (presently taking half tablet BID).  Recommend Varenicline for smoking cessation. (In Epic: varenicline tartrate, starter, 0.5mg  x 11 & 1mg  x 42 TBPK).   Days 1 to 3 : 0.5mg  once daily Days 4 to 7: 0.5mg  twice daily Day 8 forward: 1mg  BID Take 1mg  BID for 11 weeks  He can either pick a 'quit date' and stop on day 8 of taking Varenicline.   Alternatively, can choose a gradual quit date: start varenicline, week 4 reduce reduce from 1.5 PPD to 0.75 PPD (~15 cigarettes per day) and then by week 8 reduce to ~7 cigarettes per day and continue reducing with goal to quit by week 12.  Referral placed to [facility] Smoking cessation as well and they will reach out to him.   Seretha Estabrooks S Davita Sublett, NP

## 2023-12-06 MED ORDER — VARENICLINE TARTRATE (STARTER) 0.5 MG X 11 & 1 MG X 42 PO TBPK: ORAL_TABLET | ORAL | 0 refills | Status: DC

## 2023-12-06 NOTE — Addendum Note (Signed)
 Addended by: Einar Grave on: 12/06/2023 02:02 PM   Modules accepted: Orders

## 2023-12-06 NOTE — Telephone Encounter (Signed)
 Recommendations reviewed with pt as per Neomi Banks, NP's note.  Pt verbalized understanding and had no additional questions.

## 2023-12-10 ENCOUNTER — Telehealth: Payer: Self-pay

## 2023-12-10 NOTE — Telephone Encounter (Signed)
 Left message on My Chart with lab results per Dr. Vanetta Shawl note. Routed to PCP.

## 2023-12-20 ENCOUNTER — Ambulatory Visit: Payer: Self-pay | Admitting: Cardiology

## 2023-12-20 DIAGNOSIS — R002 Palpitations: Secondary | ICD-10-CM

## 2023-12-20 NOTE — Telephone Encounter (Signed)
 Informed pt of normal monitor results and also normal PSA. Pt verbalized understanding and had no further questions.

## 2023-12-20 NOTE — Telephone Encounter (Signed)
-----   Message from Terrance Ferretti sent at 12/20/2023  4:00 PM EDT ----- Your monitor revealed that you are mostly in a sinus rhythm which is normal, you did have episodes of first-degree AV block, which is not concerning and would not cause you to have any symptoms.  You  did have occasional beats that occur from the top part of your heart, these are not worrisome however you may feel it as a skipped beat or that your heart is racing.  Overall this was a normal  monitor.  I do think not we need to start you on any medications for this as the medications may make you feel worse overall. ----- Message ----- From: Manfred Seed, MD Sent: 12/20/2023  10:32 AM EDT To: Terrance Ferretti, NP

## 2024-01-03 DIAGNOSIS — M5412 Radiculopathy, cervical region: Secondary | ICD-10-CM | POA: Diagnosis not present

## 2024-01-11 ENCOUNTER — Ambulatory Visit (HOSPITAL_BASED_OUTPATIENT_CLINIC_OR_DEPARTMENT_OTHER): Admitting: Radiology

## 2024-01-13 DIAGNOSIS — M4722 Other spondylosis with radiculopathy, cervical region: Secondary | ICD-10-CM | POA: Diagnosis not present

## 2024-01-21 ENCOUNTER — Telehealth: Payer: Self-pay

## 2024-01-21 NOTE — Telephone Encounter (Signed)
 Updated dx for DOS 11/23/2023 faxed to [facility].

## 2024-01-24 DIAGNOSIS — M5412 Radiculopathy, cervical region: Secondary | ICD-10-CM | POA: Diagnosis not present

## 2024-02-01 ENCOUNTER — Ambulatory Visit: Admitting: Allergy

## 2024-02-04 ENCOUNTER — Ambulatory Visit (HOSPITAL_BASED_OUTPATIENT_CLINIC_OR_DEPARTMENT_OTHER): Admitting: Radiology

## 2024-02-11 ENCOUNTER — Ambulatory Visit (INDEPENDENT_AMBULATORY_CARE_PROVIDER_SITE_OTHER)
Admission: RE | Admit: 2024-02-11 | Discharge: 2024-02-11 | Disposition: A | Discharge status: Home / Self Care | Source: Ambulatory Visit | Attending: Allergy | Admitting: Allergy

## 2024-02-11 DIAGNOSIS — F17210 Nicotine dependence, cigarettes, uncomplicated: Secondary | ICD-10-CM

## 2024-02-11 DIAGNOSIS — Z122 Encounter for screening for malignant neoplasm of respiratory organs: Secondary | ICD-10-CM

## 2024-02-15 ENCOUNTER — Ambulatory Visit: Admitting: Allergy

## 2024-02-21 DIAGNOSIS — Z79899 Other long term (current) drug therapy: Secondary | ICD-10-CM | POA: Diagnosis not present

## 2024-02-21 DIAGNOSIS — R9431 Abnormal electrocardiogram [ECG] [EKG]: Secondary | ICD-10-CM | POA: Diagnosis not present

## 2024-02-21 DIAGNOSIS — J449 Chronic obstructive pulmonary disease, unspecified: Secondary | ICD-10-CM | POA: Diagnosis not present

## 2024-02-21 DIAGNOSIS — K821 Hydrops of gallbladder: Secondary | ICD-10-CM | POA: Diagnosis not present

## 2024-02-21 DIAGNOSIS — R1084 Generalized abdominal pain: Secondary | ICD-10-CM | POA: Diagnosis not present

## 2024-02-21 DIAGNOSIS — Z7982 Long term (current) use of aspirin: Secondary | ICD-10-CM | POA: Diagnosis not present

## 2024-02-21 DIAGNOSIS — R1111 Vomiting without nausea: Secondary | ICD-10-CM | POA: Diagnosis not present

## 2024-02-21 DIAGNOSIS — N200 Calculus of kidney: Secondary | ICD-10-CM | POA: Diagnosis not present

## 2024-02-22 NOTE — Progress Notes (Signed)
 Cardiology Office Note:  .   Date:  02/23/2024  ID:  Reginald Nguyen, DOB [birth year], MRN [id number] PCP: Lenon Homer, MD  [facility] Providers Cardiologist:  Lamar Fitch, MD    History of Present Illness: .    Reginald Nguyen is a 63 y.o. 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  12/20/2023 monitor average HR 79 bpm, SVE's occasional at 4.6% 06/30/2022 vascular ultrasound ABI was normal 03/18/2022 MPI revealed no ischemia 12/10/2020 echocardiogram EF 55 to 60%, mild concentric LVH, grade 1 DD, normal PASP, no valvular abnormalities, moderate dilatation of the aortic root 39 mm. 12/04/2020 monitor revealed average heart rate 71 bpm, predominant underlying rhythm was sinus, first-degree AV block was present, 2 episodes of SVT occurred 09/23/2019 left heart cath at [facility] revealed mild nonobstructive CAD, mid LAD 20% stenosed, proximal RCA 25% stenosed  Previously established with [facility], he had had a left heart cath in 2007 revealing nonobstructive CAD.  Repeat left heart cath in 2021 revealing mild nonobstructive CAD, no intervention.  He then reestablished with Dr. Krasowski in 2022 for management of his CAD.  Evaluated by myself on 07/12/2023, stable from a cardiac perspective, we optimized his lipid-lowering regimen, plans to follow-up in 3 months per his request. Evaluated by myself on 11/23/2023, he was bothered by palpitations, we arranged for repeat monitor, which was largely unchanged and made plans to follow up in 3 months.   Evaluated in the Emergency Department on 02/21/24 for abdominal pain, nausea and vomiting, consideration for outpatient cholecystectomy.  He presents today for follow up, he is doing ok cardiac wise, but dealing with ongoing abdominal pain >> follow up with his PCP on Monday to see if he needs  to see a surgeon for a cholecystectomy. He lives alone, does not participate in any physical activity but can take care of his own affairs on his own. He denies chest pain, palpitations, dyspnea, pnd, orthopnea, n, v, dizziness, syncope, edema, weight gain, or early satiety.     ROS: Review of Systems  Constitutional: Negative.   HENT: Negative.    Eyes: Negative.   Respiratory: Negative.    Gastrointestinal:  Positive for abdominal pain.  Genitourinary: Negative.   Musculoskeletal:  Positive for back pain and neck pain.  Skin: Negative.   Endo/Heme/Allergies: Negative.   Psychiatric/Behavioral: Negative.      Studies Reviewed: SABRA   EKG Interpretation Date/Time:  Wednesday February 23 2024 13:10:31 EDT Ventricular Rate:  71 PR Interval:  242 QRS Duration:  90 QT Interval:  406 QTC Calculation: 441 R Axis:   -75  Text Interpretation: Sinus rhythm with 1st degree A-V block Left axis deviation When compared with ECG of 11-Jan-2023 09:44, No significant change was found Confirmed by Carlin Nest [phone]) on 02/23/2024 1:12:08 PM    Cardiac Studies & Procedures   ______________________________________________________________________________________________   STRESS TESTS  NM MYOCAR MULTI W/SPECT W 08/30/2019  Narrative CLINICAL DATA:  Chest pain  EXAM: MYOCARDIAL IMAGING WITH SPECT (REST AND PHARMACOLOGIC-STRESS)  GATED LEFT VENTRICULAR WALL MOTION STUDY  LEFT VENTRICULAR EJECTION FRACTION  TECHNIQUE: Standard myocardial SPECT imaging was performed after resting intravenous injection of 10.7 mCi Tc-79m tetrofosmin . Subsequently, intravenous infusion of Lexiscan  was performed under the supervision of the Cardiology staff. At peak effect of the drug, 30 mCi Tc-49m tetrofosmin  was injected intravenously and standard myocardial SPECT  imaging was performed. Quantitative gated imaging was also performed to evaluate left ventricular wall motion, and estimate left ventricular  ejection fraction.  COMPARISON:  None.  FINDINGS: Perfusion: There is a small to moderate size region of mild reversibility involving the distal inferolateral wall.  Wall Motion: Normal left ventricular wall motion. No left ventricular dilation.  Left Ventricular Ejection Fraction: 60 %  End diastolic volume 101 ml  End systolic volume 41 ml  IMPRESSION: 1. Small to moderate size area of mild reversibility is noted within the distal inferolateral wall.  2. Normal left ventricular wall motion.  3. Left ventricular ejection fraction 60%  4. Non invasive risk stratification*: Intermediate  *2012 Appropriate Use Criteria for Coronary Revascularization Focused Update: J Am Coll Cardiol. 2012;59(9):857-881. http://content.dementiazones.com.aspx?articleid=1201161  These results will be called to the ordering clinician or representative by the Radiologist Assistant, and communication documented in the PACS or zVision Dashboard.   Electronically Signed By: Waddell Calk M.D. On: 08/30/2019 16:46   ECHOCARDIOGRAM  ECHOCARDIOGRAM COMPLETE 12/10/2020  Narrative ECHOCARDIOGRAM REPORT    Patient Name:   Reginald Nguyen Date of Exam: 12/10/2020 Medical Rec #:  [id number]    Height:       71.5 in Accession #:    [id number]   Weight:       227.0 lb Date of Birth:  Oct 14, 1960     BSA:          2.237 m Patient Age:    60 years     BP:           102/60 mmHg Patient Gender: M            HR:           60 bpm. Exam Location:  [place]  Procedure: 2D Echo  Indications:    Coronary artery disease involving native coronary artery of native heart without angina pectoris [I25.10 (ICD-10-CM)]; Chronic obstructive pulmonary disease, unspecified COPD type (HCC) [J44.9 (ICD-10-CM)]; Dyslipidemia [E78.5 (ICD-10-CM)]; Dizziness [R42 (ICD-10-CM)]; Syncope and collapse Discordia.Diesel (ICD-10-CM)]; Palpitations [R00.2 (ICD-10-CM)]  History:        Patient has prior history of Echocardiogram  examinations, most recent 08/29/2019. Previous Myocardial Infarction, Chronic obstructive pulmonary disease, Signs/Symptoms:Chest Pain; Risk Factors:Current Smoker.  Sonographer:    Lynwood Silvas Referring Phys: [phone] ROBERT J KRASOWSKI  IMPRESSIONS   1. Left ventricular ejection fraction, by estimation, is 55 to 60%. The left ventricle has normal function. The left ventricle has no regional wall motion abnormalities. There is mild concentric left ventricular hypertrophy. Left ventricular diastolic parameters are consistent with Grade I diastolic dysfunction (impaired relaxation). The average left ventricular global longitudinal strain is -8.2 %. The global longitudinal strain is abnormal. 2. Right ventricular systolic function is normal. The right ventricular size is normal. There is normal pulmonary artery systolic pressure. 3. The mitral valve is normal in structure. No evidence of mitral valve regurgitation. No evidence of mitral stenosis. 4. The aortic valve is tricuspid. Aortic valve regurgitation is not visualized. No aortic stenosis is present. 5. Aortic normal DTA. There is moderate dilatation of the ascending aorta and of the aortic root, measuring 39 mm. 6. The inferior vena cava is normal in size with greater than 50% respiratory variability, suggesting right atrial pressure of 3 mmHg.  FINDINGS Left Ventricle: Left ventricular ejection fraction, by estimation, is 55 to 60%. The left ventricle has normal function. The left ventricle has no regional wall motion abnormalities. The average left ventricular global longitudinal strain is -8.2 %. The  global longitudinal strain is abnormal. The left ventricular internal cavity size was normal in size. There is mild concentric left ventricular hypertrophy. Left ventricular diastolic parameters are consistent with Grade I diastolic dysfunction (impaired relaxation). Normal left ventricular filling pressure.  Right Ventricle: The right  ventricular size is normal. No increase in right ventricular wall thickness. Right ventricular systolic function is normal. There is normal pulmonary artery systolic pressure. The tricuspid regurgitant velocity is 1.38 m/s, and with an assumed right atrial pressure of 8 mmHg, the estimated right ventricular systolic pressure is 15.6 mmHg.  Left [facility]: Left atrial size was normal in size.  Right [facility]: Right atrial size was normal in size.  Pericardium: There is no evidence of pericardial effusion.  Mitral Valve: The mitral valve is normal in structure. No evidence of mitral valve regurgitation. No evidence of mitral valve stenosis.  Tricuspid Valve: The tricuspid valve is normal in structure. Tricuspid valve regurgitation is trivial. No evidence of tricuspid stenosis.  Aortic Valve: The aortic valve is tricuspid. Aortic valve regurgitation is not visualized. No aortic stenosis is present.  Pulmonic Valve: The pulmonic valve was normal in structure. Pulmonic valve regurgitation is not visualized. No evidence of pulmonic stenosis.  Aorta: Normal DTA and the aortic arch was not well visualized. There is moderate dilatation of the ascending aorta and of the aortic root, measuring 39 mm.  Venous: The pulmonary veins were not well visualized. The inferior vena cava is normal in size with greater than 50% respiratory variability, suggesting right atrial pressure of 3 mmHg.  IAS/Shunts: No atrial level shunt detected by color flow Doppler.   LEFT VENTRICLE PLAX 2D LVIDd:         5.00 cm  Diastology LVIDs:         3.60 cm  LV e' medial:    6.53 cm/s LV PW:         1.10 cm  LV E/e' medial:  11.4 LV IVS:        1.10 cm  LV e' lateral:   9.25 cm/s LVOT diam:     2.20 cm  LV E/e' lateral: 8.1 LV SV:         78 LV SV Index:   35       2D Longitudinal Strain LVOT Area:     3.80 cm 2D Strain GLS Avg:     -8.2 %   RIGHT VENTRICLE            IVC RV S prime:     7.18 cm/s  IVC diam: 2.00  cm TAPSE (M-mode): 2.1 cm  LEFT ATRIUM             Index       RIGHT ATRIUM           Index LA diam:        3.20 cm 1.43 cm/m  RA Area:     13.90 cm LA Vol (A2C):   44.6 ml 19.94 ml/m RA Volume:   34.30 ml  15.33 ml/m LA Vol (A4C):   39.6 ml 17.70 ml/m LA Biplane Vol: 45.1 ml 20.16 ml/m AORTIC VALVE LVOT Vmax:   92.90 cm/s LVOT Vmean:  65.300 cm/s LVOT VTI:    0.204 m  AORTA Ao Root diam: 3.90 cm Ao Asc diam:  3.90 cm Ao Desc diam: 2.10 cm  MITRAL VALVE               TRICUSPID VALVE MV Area (PHT): 3.45 cm  TR Peak grad:   7.6 mmHg MV Decel Time: 220 msec    TR Vmax:        138.00 cm/s MV E velocity: 74.60 cm/s MV A velocity: 82.70 cm/s  SHUNTS MV E/A ratio:  0.90        Systemic VTI:  0.20 m Systemic Diam: 2.20 cm  Redell Leiter MD Electronically signed by Redell Leiter MD Signature Date/Time: 12/10/2020/11:53:30 AM    Final    MONITORS  LONG TERM MONITOR (3-14 DAYS) 12/03/2023  Narrative Patch Wear Time:  2 days and 1 hours (2025-05-20T14:01:10-398 to 2025-05-22T15:21:50-0400)  Patient had a min HR of 62 bpm, max HR of 121 bpm, and avg HR of 79 bpm. Predominant underlying rhythm was Sinus Rhythm. First Degree AV Block was present. Isolated SVEs were occasional (4.6%, 10741), SVE Couplets were rare (<1.0%, 52), and SVE Triplets were rare (<1.0%, 24). Isolated VEs were rare (<1.0%), and no VE Couplets or VE Triplets were present.  Summary and conclusions: First-degree AV block noted. Triggered events for APCs Otherwise normal monitor       ______________________________________________________________________________________________      Risk Assessment/Calculations:             Physical Exam:   VS:  BP 98/72   Pulse 71   Ht 5' 11 (1.803 m)   Wt 213 lb 3.2 oz (96.7 kg)   SpO2 96%   BMI 29.74 kg/m    Wt Readings from Last 3 Encounters:  02/23/24 213 lb 3.2 oz (96.7 kg)  11/23/23 213 lb 6.4 oz (96.8 kg)  09/21/23 213 lb (96.6 kg)    GEN: Well  nourished, well developed in no acute distress NECK: No JVD; No carotid bruits CARDIAC: RRR, no murmurs, rubs, gallops RESPIRATORY:  Clear to auscultation without rales, wheezing or rhonchi  ABDOMEN: Soft, non-tender, non-distended EXTREMITIES:  No edema; No deformity   ASSESSMENT AND PLAN: .   CAD - nonobstructive per LHC in 2021, Stable with no anginal symptoms. No indication for ischemic evaluation.  Continue aspirin  81 mg daily, continue Lipitor 80 mg daily, continue nitroglycerin  as needed, continue fenofibrate  145 mg daily, continue metoprolol  12.5 mg twice daily. Heart healthy diet and regular cardiovascular exercise encouraged.    HTN - blood pressure today is well-controlled at 120/72, continue metoprolol  12.5 mg twice daily.  Palpitations-will arrange for 2-week monitor for contributory causes.  HLD/medication management - FLP and LFTs obtained today, currently he is on Lipitor 80 mg daily as well as fenofibrate  145 mg daily.  Tobacco abuse-this is clearly been difficult for him to stop smoking, he currently smokes approximately 1.5 PPD, offered help with cessation however he wants to try on his own at this time.  Preoperative cardiovascular evaluation - We have not received an official preoperative evaluation request, however he was evaluated in the ED with plans to follow up outpatient for cholecystectomy. According to the Revised Cardiac Risk Index (RCRI), his Perioperative Risk of Major Cardiac Event is (%): 0.4 His Functional Capacity in METs is: 6.05 according to the Duke Activity Status Index (DASI).  Therefore, based on ACC/AHA guidelines, patient would be at acceptable risk for the planned procedure without further cardiovascular testing. I will route this recommendation to the requesting party via Epic fax function. If there is a request to hold aspirin , it can be held 7 days prior to surgery and resumed as directed by the surgeon.     Dispo:  follow-up in 3 months per his  request.  Signed,  Delon JAYSON Hoover, NP

## 2024-02-23 ENCOUNTER — Ambulatory Visit: Attending: Cardiology | Admitting: Cardiology

## 2024-02-23 ENCOUNTER — Encounter: Payer: Self-pay | Admitting: Cardiology

## 2024-02-23 VITALS — BP 98/72 | HR 71 | Ht 71.0 in | Wt 213.2 lb

## 2024-02-23 DIAGNOSIS — Z720 Tobacco use: Secondary | ICD-10-CM | POA: Diagnosis not present

## 2024-02-23 DIAGNOSIS — E782 Mixed hyperlipidemia: Secondary | ICD-10-CM

## 2024-02-23 DIAGNOSIS — Z01818 Encounter for other preprocedural examination: Secondary | ICD-10-CM

## 2024-02-23 DIAGNOSIS — R002 Palpitations: Secondary | ICD-10-CM | POA: Diagnosis not present

## 2024-02-23 DIAGNOSIS — I10 Essential (primary) hypertension: Secondary | ICD-10-CM

## 2024-02-23 DIAGNOSIS — I2510 Atherosclerotic heart disease of native coronary artery without angina pectoris: Secondary | ICD-10-CM | POA: Diagnosis not present

## 2024-02-23 NOTE — Patient Instructions (Signed)
 Medication Instructions:  Your physician recommends that you continue on your current medications as directed. Please refer to the Current Medication list given to you today.  *If you need a refill on your cardiac medications before your next appointment, please call your pharmacy*  Lab Work: None If you have labs (blood work) drawn today and your tests are completely normal, you will receive your results only by: MyChart Message (if you have MyChart) OR A paper copy in the mail If you have any lab test that is abnormal or we need to change your treatment, we will call you to review the results.  Testing/Procedures: None  Follow-Up: At [facility], you and your health needs are our priority.  As part of our continuing mission to provide you with exceptional heart care, our providers are all part of one team.  This team includes your primary Cardiologist (physician) and Advanced Practice Providers or APPs (Physician Assistants and Nurse Practitioners) who all work together to provide you with the care you need, when you need it.  Your next appointment:   3 month(s)  Provider:   Delon Hoover, NP [place])    We recommend signing up for the patient portal called MyChart.  Sign up information is provided on this After Visit Summary.  MyChart is used to connect with patients for Virtual Visits (Telemedicine).  Patients are able to view lab/test results, encounter notes, upcoming appointments, etc.  Non-urgent messages can be sent to your provider as well.   To learn more about what you can do with MyChart, go to [email or web address].   Other Instructions None

## 2024-02-28 DIAGNOSIS — G935 Compression of brain: Secondary | ICD-10-CM | POA: Diagnosis not present

## 2024-02-28 DIAGNOSIS — E785 Hyperlipidemia, unspecified: Secondary | ICD-10-CM | POA: Diagnosis not present

## 2024-02-28 DIAGNOSIS — K821 Hydrops of gallbladder: Secondary | ICD-10-CM | POA: Diagnosis not present

## 2024-02-28 DIAGNOSIS — Z720 Tobacco use: Secondary | ICD-10-CM | POA: Diagnosis not present

## 2024-02-28 DIAGNOSIS — N1832 Chronic kidney disease, stage 3b: Secondary | ICD-10-CM | POA: Diagnosis not present

## 2024-02-28 DIAGNOSIS — J410 Simple chronic bronchitis: Secondary | ICD-10-CM | POA: Diagnosis not present

## 2024-02-28 DIAGNOSIS — I129 Hypertensive chronic kidney disease with stage 1 through stage 4 chronic kidney disease, or unspecified chronic kidney disease: Secondary | ICD-10-CM | POA: Diagnosis not present

## 2024-02-28 DIAGNOSIS — Z13228 Encounter for screening for other metabolic disorders: Secondary | ICD-10-CM | POA: Diagnosis not present

## 2024-03-01 ENCOUNTER — Ambulatory Visit: Admitting: Podiatry

## 2024-03-01 ENCOUNTER — Encounter: Payer: Self-pay | Admitting: Cardiology

## 2024-03-07 ENCOUNTER — Ambulatory Visit (INDEPENDENT_AMBULATORY_CARE_PROVIDER_SITE_OTHER): Admitting: Allergy

## 2024-03-07 ENCOUNTER — Encounter: Payer: Self-pay | Admitting: Allergy

## 2024-03-07 VITALS — BP 110/62 | HR 71 | Resp 16

## 2024-03-07 DIAGNOSIS — J438 Other emphysema: Secondary | ICD-10-CM | POA: Diagnosis not present

## 2024-03-07 DIAGNOSIS — J3089 Other allergic rhinitis: Secondary | ICD-10-CM | POA: Diagnosis not present

## 2024-03-07 DIAGNOSIS — J4540 Moderate persistent asthma, uncomplicated: Secondary | ICD-10-CM

## 2024-03-07 DIAGNOSIS — Z91018 Allergy to other foods: Secondary | ICD-10-CM | POA: Diagnosis not present

## 2024-03-07 DIAGNOSIS — F17200 Nicotine dependence, unspecified, uncomplicated: Secondary | ICD-10-CM

## 2024-03-07 MED ORDER — BUDESONIDE 0.5 MG/2ML IN SUSP: 0.5000 mg | Freq: Two times a day (BID) | RESPIRATORY_TRACT | 3 refills | Status: AC

## 2024-03-07 MED ORDER — ALBUTEROL SULFATE HFA 108 (90 BASE) MCG/ACT IN AERS: 2.0000 | INHALATION_SPRAY | RESPIRATORY_TRACT | 1 refills | Status: AC | PRN

## 2024-03-07 MED ORDER — LEVOCETIRIZINE DIHYDROCHLORIDE 5 MG PO TABS: 5.0000 mg | ORAL_TABLET | Freq: Every evening | ORAL | 5 refills | Status: DC

## 2024-03-07 NOTE — Progress Notes (Signed)
 Follow-up Note  RE: Reginald Nguyen MRN: [id number] DOB: Nov 08, 1960 Date of Office Visit: 03/07/2024   History of present illness: Reginald Nguyen is a 63 y.o. male presenting today for follow-up of asthma.  He was last seen in the office on 09/21/23 by myself.  Discussed the use of AI scribe software for clinical note transcription with the patient, who gave verbal consent to proceed.  He has a history of smoking and was recently evaluated with a chest CT to assess for lung cancer screening due to his smoking history. He has been under the care of a cardiologist for CAD. He experiences intermittent pain on his side and states he needs to get his gallbladder removed which is an upcoming surgery.  He also mentions after that he needs to have surgery on his neck.  He is attempting to cut back on smoking and has tried various methods to quit, including Chantix , patches, and gum, but has not found them effective. He is currently exploring alternative cigarettes to reduce his smoking further.  He has budesonide  via a nebulizer twice daily to manage his lung condition however he is not performing this as he states he does not know how to use the nebulizer. He has not been taking Xyzal  as states it was not covered with insurance.      Review of systems: 10pt ROS negative unless noted above in HPI   Past medical/social/surgical/family history have been reviewed and are unchanged unless specifically indicated below.  No changes  Medication List: Current Outpatient Medications  Medication Sig Dispense Refill   albuterol  (VENTOLIN  HFA) 108 (90 Base) MCG/ACT inhaler Inhale 2 puffs into the lungs every 4 (four) hours as needed for wheezing or shortness of breath. 18 g 2   aspirin  EC 81 MG tablet Take 81 mg by mouth 2 (two) times daily. Swallow whole.     atorvastatin  (LIPITOR) 80 MG tablet Take 80 mg by mouth daily.     budesonide  (PULMICORT ) 0.5 MG/2ML nebulizer solution Take 2 mLs (0.5 mg total) by  nebulization 2 (two) times daily. 120 mL 3   busPIRone  (BUSPAR ) 30 MG tablet Take 50 mg by mouth daily.     ciclopirox  (PENLAC ) 8 % solution Apply topically at bedtime. Apply over nail and surrounding skin. Apply daily over previous coat. After seven (7) days, may remove with alcohol and continue cycle. 6.6 mL 0   clonazePAM  (KLONOPIN ) 1 MG tablet Take 1.5 mg by mouth daily as needed for anxiety.     dicyclomine (BENTYL) 20 MG tablet Take 20 mg by mouth every 6 (six) hours.     doxepin  (SINEQUAN ) 150 MG capsule Take 150 mg by mouth at bedtime.     fenofibrate  (TRICOR ) 145 MG tablet Take 1 tablet (145 mg total) by mouth daily. 90 tablet 1   gabapentin  (NEURONTIN ) 600 MG tablet Take 600 mg by mouth 3 (three) times daily.     lamoTRIgine  (LAMICTAL ) 25 MG tablet Take 50 mg by mouth daily.     meclizine (ANTIVERT) 25 MG tablet TAKE ONE TABLET BY MOUTH EVERY 6 TO 8 HOURS AS NEEDED DIZZINESS; NEEDS APPOINTMENT     meloxicam (MOBIC) 7.5 MG tablet Take 7.5 mg by mouth 2 (two) times daily.     metoprolol  tartrate (LOPRESSOR ) 25 MG tablet TAKE 1/2 TABLET BY MOUTH EVERY DAY and TAKE 1/2 TABLET BY MOUTH DAIILY AT 3 PM 90 tablet 2   nitroGLYCERIN  (NITROSTAT ) 0.4 MG SL tablet Place 1 tablet (0.4  mg total) under the tongue every 5 (five) minutes as needed for chest pain. 25 tablet 11   ondansetron  (ZOFRAN -ODT) 4 MG disintegrating tablet Take 4 mg by mouth every 6 (six) hours as needed for vomiting or nausea.     pantoprazole  (PROTONIX ) 40 MG tablet Take 40 mg by mouth 2 (two) times daily.      PROZAC  20 MG capsule Take 20 mg by mouth daily.     topiramate (TOPAMAX) 50 MG tablet Take 1 tablet by mouth 2 (two) times daily.     TREMFYA 100 MG/ML pen Inject into the skin every 8 (eight) weeks.     triamcinolone  cream (KENALOG ) 0.1 % Apply 1 Application topically daily as needed (rash).     Varenicline  Tartrate, Starter, 0.5 MG X 11 & 1 MG X 42 TBPK Use as directed 1 each 0   No current facility-administered  medications for this visit.     Known medication allergies: Allergies  Allergen Reactions   Bioflavonoids     Other Reaction(s): abdominal pain   Morphine Other (See Comments) and Anaphylaxis    ask   Nisoldipine Other (See Comments)   Calcium  Oxide Hives    Lemons and limes   Codeine Other (See Comments)    ask HIVES   Mushroom    Mushroom Extract Complex (Obsolete)    Other     Lemons, Limes, Mushrooms  Has to have allergy  shots   Sulfa Antibiotics Other (See Comments) and Diarrhea    IBS GI Upset     Physical examination: Blood pressure 110/62, pulse 71, resp. rate 16, SpO2 97%.  General: Alert, interactive, in no acute distress. HEENT: PERRLA, TMs pearly gray, turbinates mildly edematous without discharge, post-pharynx non erythematous. Neck: Supple without lymphadenopathy. Lungs: Mildly decreased breath sounds bilaterally without wheezing, rhonchi or rales. {no increased work of breathing. CV: Normal S1, S2 without murmurs. Abdomen: Nondistended, nontender. Skin: Warm and dry, without lesions or rashes. Extremities:  No clubbing, cyanosis or edema. Neuro:   Grossly intact.  Diagnostics/Labs: Lung screening chest CT 02/11/24:  FINDINGS: Cardiovascular: Normal heart size. No significant pericardial effusion/thickening. Left anterior descending and right coronary atherosclerosis. Atherosclerotic nonaneurysmal thoracic aorta. Normal caliber pulmonary arteries.   Mediastinum/Nodes: No significant thyroid  nodules. Unremarkable esophagus. No pathologically enlarged axillary, mediastinal or hilar lymph nodes, noting limited sensitivity for the detection of hilar adenopathy on this noncontrast study.   Lungs/Pleura: No pneumothorax. No pleural effusion. Moderate paraseptal and centrilobular emphysema with diffuse bronchial wall thickening. No acute consolidative airspace disease or lung masses. No significant pulmonary nodules. Stable tiny calcified 1 mm anterior  left upper lobe granuloma.   Upper abdomen: Isodense material in the dependent fundal gallbladder lumen, compatible with sludge as seen on 09/05/2023 sonogram. Simple 2.9 cm medial interpolar right renal cyst, for which no follow-up imaging is recommended.   Musculoskeletal: No aggressive appearing focal osseous lesions. Moderate thoracic spondylosis.  IMPRESSION: 1. Lung-RADS 1, negative. Continue annual screening with low-dose chest CT without contrast in 12 months. 2. Two-vessel coronary atherosclerosis. 3. Aortic Atherosclerosis (ICD10-I70.0) and Emphysema (ICD10-J43.9).  Assessment and plan:   Allergies     - continue avoidance measures for tree pollen     - allergen avoidance measures provided     - take Xyzal  5mg  daily -- will advise pharmacy to run through secondary insurance medicaid for coverage        - use nasal saline spray to help keep nose moisturized  Emphysema Asthma Smoking     -  have access to albuterol  inhaler 2 puffs every 4-6 hours as needed for cough/wheeze/shortness of breath/chest tightness.  May use 15-20 minutes prior to activity.   Monitor frequency of use.      - start Budesonide  0.5mg  vials 1 vial in nebulizer twice a day.  Rinse mouth after use.  This should hopefully reduce your risk of thrush.   Once he is using this appropriately and tolerating then will increase to add in LABA/LAMA combination for triple therapy regimen.    - continue to cut back smoking.    Asthma control goals:  Full participation in all desired activities (may need albuterol  before activity) Albuterol  use two time or less a week on average (not counting use with activity) Cough interfering with sleep two time or less a month Oral steroids no more than once a year No hospitalizations      -  your lung cancer screening chest CT did show moderate emphysema and a stable very small granuloma/nodule.    Food avoidance     - avoiding mushroom but has tolerated accidental  ingestion  Follow-up in 4-6 months or sooner if needed  I appreciate the opportunity to take part in Reginald Nguyen's care. Please do not hesitate to contact me with questions.  Sincerely,   Danita Brain, MD Allergy /Immunology [facility]

## 2024-03-07 NOTE — Patient Instructions (Addendum)
 Allergies     - continue avoidance measures for tree pollen     - allergen avoidance measures provided     - take Xyzal  5mg  daily -- will advise pharmacy to run through secondary insurance medicaid for coverage        - use nasal saline spray to help keep nose moisturized  Emphysema Asthma Smoking     - have access to albuterol  inhaler 2 puffs every 4-6 hours as needed for cough/wheeze/shortness of breath/chest tightness.  May use 15-20 minutes prior to activity.   Monitor frequency of use.      - start Budesonide  0.5mg  vials 1 vial in nebulizer twice a day.  Rinse mouth after use.  This should hopefully reduce your risk of thrush.      - continue to cut back smoking.    Asthma control goals:  Full participation in all desired activities (may need albuterol  before activity) Albuterol  use two time or less a week on average (not counting use with activity) Cough interfering with sleep two time or less a month Oral steroids no more than once a year No hospitalizations      -  your lung cancer screening chest CT did show moderate emphysema and a stable very small granuloma/nodule.    Food avoidance     - avoiding mushroom but has tolerated accidental ingestion  Follow-up in 4-6 months or sooner if needed

## 2024-03-08 ENCOUNTER — Telehealth: Payer: Self-pay | Admitting: Cardiology

## 2024-03-08 NOTE — Telephone Encounter (Signed)
 Results reviewed with pt as per Delon Hoover NP's note.  Pt verbalized understanding and had no additional questions. Routed to PCP.  Pt wants to know if he is okay to have his gallbladder surgery.

## 2024-03-08 NOTE — Telephone Encounter (Signed)
 Pt called in asking if [facility], NP  can review his CT scan from 8/8. He was not sure if she mentioned it at last appt or not. Please advise.

## 2024-03-09 NOTE — Telephone Encounter (Signed)
 Left vm to return call.

## 2024-03-09 NOTE — Telephone Encounter (Signed)
Recommendations reviewed with pt as per [name], NP's note.  Pt verbalized understanding and had no additional questions.

## 2024-03-21 DIAGNOSIS — K811 Chronic cholecystitis: Secondary | ICD-10-CM | POA: Diagnosis not present

## 2024-03-27 ENCOUNTER — Telehealth: Payer: Self-pay

## 2024-03-27 NOTE — Telephone Encounter (Signed)
   Pre-operative Risk Assessment    Patient Name: Reginald Nguyen  DOB: 08/24/1960 MRN: [id number]  Date of last office visit: 02-23-2024 Date of next office visit: 06-12-2024   Request for Surgical Clearance    Procedure:  Laparoscopic Cholecystectomy   Date of Surgery:  Clearance 04/13/24                                  Surgeon:  Lynwood Search, MD Surgeon's Group or Practice Name:  [facility] Phone number:  [phone] Fax number:  [phone]  - Medical   Type of Anesthesia:  General     Additional requests/questions:  Most recent labs, chest X-ray results, EKG, Stress Test, Echo, Pacemaker Information  Signed, Olam JONETTA Lesches   03/27/2024, 9:43 AM

## 2024-03-27 NOTE — Telephone Encounter (Addendum)
   Patient Name: Reginald Nguyen  DOB: 1961-06-01 MRN: [id number]  Primary Cardiologist: Lamar Fitch, MD  Chart reviewed as part of pre-operative protocol coverage. Patient was recently seen by Delon Hoover, NP, in 02/2024 at which time she was stable from a cardiac standpoint but was having ongoing issues with abdominal pain. There was mention of possible need for cholecystectomy at that time. Per Delon Sexton note at that time: We have not received an official preoperative evaluation request, however he was evaluated in the ED with plans to follow up outpatient for cholecystectomy. According to the Revised Cardiac Risk Index (RCRI), his Perioperative Risk of Major Cardiac Event is (%): 0.4 His Functional Capacity in METs is: 6.05 according to the Duke Activity Status Index (DASI).  Therefore, based on ACC/AHA guidelines, patient would be at acceptable risk for the planned procedure without further cardiovascular testing. I will route this recommendation to the requesting party via Epic fax function. If there is a request to hold aspirin , it can be held 7 days prior to surgery and resumed as directed by the surgeon.  I will route this recommendation to the requesting party via Epic fax function and remove from pre-op pool. I will route Delon Hoover, NP, last office visit as well which contains summaries of patient's last EKG, stress test, Echo, outpatient monitor since these were requested as well.   Please call with questions.  Maciah Schweigert E Mikayah Joy, PA-C 03/27/2024, 9:58 AM

## 2024-04-20 DIAGNOSIS — I10 Essential (primary) hypertension: Secondary | ICD-10-CM | POA: Diagnosis not present

## 2024-04-20 DIAGNOSIS — R42 Dizziness and giddiness: Secondary | ICD-10-CM | POA: Diagnosis not present

## 2024-04-20 DIAGNOSIS — Z720 Tobacco use: Secondary | ICD-10-CM | POA: Diagnosis not present

## 2024-04-20 DIAGNOSIS — Z23 Encounter for immunization: Secondary | ICD-10-CM | POA: Diagnosis not present

## 2024-04-27 ENCOUNTER — Other Ambulatory Visit: Payer: Self-pay

## 2024-05-01 MED ORDER — FENOFIBRATE 145 MG PO TABS: 145.0000 mg | ORAL_TABLET | Freq: Every day | ORAL | 2 refills | Status: AC

## 2024-05-08 DIAGNOSIS — K811 Chronic cholecystitis: Secondary | ICD-10-CM | POA: Diagnosis not present

## 2024-05-08 DIAGNOSIS — Z9049 Acquired absence of other specified parts of digestive tract: Secondary | ICD-10-CM | POA: Diagnosis not present

## 2024-05-18 ENCOUNTER — Ambulatory Visit: Admitting: Podiatry

## 2024-05-18 DIAGNOSIS — M79674 Pain in right toe(s): Secondary | ICD-10-CM | POA: Diagnosis not present

## 2024-05-18 DIAGNOSIS — B351 Tinea unguium: Secondary | ICD-10-CM | POA: Diagnosis not present

## 2024-05-18 DIAGNOSIS — M79675 Pain in left toe(s): Secondary | ICD-10-CM

## 2024-05-18 NOTE — Progress Notes (Signed)
 Subjective:  Patient ID: Reginald Nguyen, male    DOB: 02-Sep-1960,  MRN: [id number]  [name] presents to clinic today for:  Chief Complaint  Patient presents with   [facility]    Not diabetic, no anticoag. Need nails trimmed. No corns or callous needs.    Patient notes nails are thick and elongated, causing pain in shoe gear when ambulating.    PCP is Lenon Homer, MD.   Past Medical History:  Diagnosis Date   Acid reflux    Asthma    Barrett's esophagus    Bipolar 1 disorder (HCC) 08/29/2019   Bipolar disorder (HCC)    CAD (coronary artery disease)    CAD in native artery 02/21/2015   Formatting of this note might be different from the original. nonobstructive disease by cath 2007   Cervical arthritis 05/24/2023   Chest discomfort 03/30/2018   Chiari I malformation (HCC)    Chronic kidney disease, stage 2 (mild) 07/19/2019   Chronic obstructive pulmonary disease (HCC) 08/29/2019   Coronary artery disease involving native coronary artery of native heart without angina pectoris 02/21/2015   DDD (degenerative disc disease), cervical 06/07/2023   Dizziness    Dyslipidemia    Essential (primary) hypertension 10/15/2020   Establishing care with new doctor, encounter for 07/05/2023   First degree AV block    GERD (gastroesophageal reflux disease)    Headache disorder 03/08/2023   Heart attack (HCC)    x2   History of angina    Impacted cerumen of right ear 07/05/2023   Malaise and fatigue 08/25/2017   Male erectile dysfunction, unspecified 04/05/2019   Manic depression (HCC)    Migraine    Mixed hyperlipidemia 11/16/2018   Neck pain 05/24/2023   Other obesity due to excess calories 10/15/2020   PTSD (post-traumatic stress disorder)    since teenager    Restless legs syndrome 03/16/2017   Smoking 02/21/2015   Syncope and collapse 11/14/2020   Unstable angina (HCC) 08/29/2019   Vertigo    Vitamin D deficiency, unspecified 02/19/2020   Allergies  Allergen Reactions    Bioflavonoids     Other Reaction(s): abdominal pain   Morphine Other (See Comments) and Anaphylaxis    ask   Nisoldipine Other (See Comments)   Calcium  Oxide Hives    Lemons and limes   Codeine Other (See Comments) and Dermatitis    ask  HIVES   Mushroom    Mushroom Extract Complex (Obsolete)    Other     Lemons, Limes, Mushrooms  Has to have allergy  shots   Sulfa Antibiotics Diarrhea and Other (See Comments)    IBS  GI Upset  Other Reaction(s): Difficulty swallowing    Objective:  Reginald Nguyen is a pleasant 63 y.o. male in NAD. AAO x 3.  Vascular Examination: Patient has palpable DP pulse, absent PT pulse bilateral.  Delayed capillary refill bilateral toes.  Sparse digital hair bilateral.  Proximal to distal cooling WNL bilateral.    Dermatological Examination: Interspaces are clear with no open lesions noted bilateral.  Skin is shiny and atrophic bilateral.  Nails are 3-40mm thick, with yellowish/brown discoloration, subungual debris and distal onycholysis x10.  There is pain with compression of nails x10.   Patient qualifies for at-risk foot care because of PVD.  Assessment/Plan: 1. Pain due to onychomycosis of toenails of both feet     Mycotic nails x10 were sharply debrided with sterile nail nippers and power debriding burr to decrease bulk  and length.  F/u 3 months   Aaleah Hirsch DSABRA Imperial, DPM, FACFAS [facility]     [address] [place], [place] [address]                Office [phone]  Fax [phone]

## 2024-05-26 ENCOUNTER — Ambulatory Visit: Admitting: Cardiology

## 2024-06-12 ENCOUNTER — Ambulatory Visit: Admitting: Cardiology

## 2024-06-14 ENCOUNTER — Other Ambulatory Visit: Payer: Self-pay | Admitting: *Deleted

## 2024-06-15 ENCOUNTER — Ambulatory Visit: Attending: Cardiology | Admitting: Cardiology

## 2024-06-15 ENCOUNTER — Encounter: Payer: Self-pay | Admitting: Cardiology

## 2024-06-15 VITALS — BP 94/60 | HR 86 | Ht 71.0 in | Wt 214.0 lb

## 2024-06-15 DIAGNOSIS — I2510 Atherosclerotic heart disease of native coronary artery without angina pectoris: Secondary | ICD-10-CM | POA: Diagnosis not present

## 2024-06-15 DIAGNOSIS — J449 Chronic obstructive pulmonary disease, unspecified: Secondary | ICD-10-CM | POA: Diagnosis not present

## 2024-06-15 DIAGNOSIS — E785 Hyperlipidemia, unspecified: Secondary | ICD-10-CM | POA: Diagnosis not present

## 2024-06-15 DIAGNOSIS — I10 Essential (primary) hypertension: Secondary | ICD-10-CM | POA: Diagnosis not present

## 2024-06-15 MED ORDER — NITROGLYCERIN 0.4 MG SL SUBL: 0.4000 mg | SUBLINGUAL_TABLET | SUBLINGUAL | 3 refills | Status: AC | PRN

## 2024-06-15 NOTE — Progress Notes (Signed)
 Cardiology Office Note:    Date:  06/15/2024   ID:  Reginald Nguyen, DOB 14-Oct-1960, MRN [id number]  PCP:  Lenon Homer, MD  Cardiologist:  Lamar Fitch, MD    Referring MD: Lenon Homer, MD   Chief Complaint  Patient presents with   Follow-up  Doing fine  History of Present Illness:    Reginald Nguyen is a 63 y.o. male past medical history significant for coronary disease he did have cardiac catheterization 2007 show only minimal nonobstructive disease cardiac catheterization was repeated in 2021 which showed similar findings 10 stress test done after that showed no evidence of ischemia additional problem include smoking which sadly still ongoing, dyslipidemia, bipolar disorder.  Recently he end up going to the hospital because of abdominal pain, but surgery has been done since that time he is doing dramatically better his chest pain and abdominal pain all is gone he is doing well  Past Medical History:  Diagnosis Date   Acid reflux    Asthma    Barrett's esophagus    Bipolar 1 disorder (HCC) 08/29/2019   Bipolar disorder (HCC)    CAD (coronary artery disease)    CAD in native artery 02/21/2015   Formatting of this note might be different from the original. nonobstructive disease by cath 2007   Cervical arthritis 05/24/2023   Chest discomfort 03/30/2018   Chiari I malformation (HCC)    Chronic kidney disease, stage 2 (mild) 07/19/2019   Chronic obstructive pulmonary disease (HCC) 08/29/2019   Coronary artery disease involving native coronary artery of native heart without angina pectoris 02/21/2015   DDD (degenerative disc disease), cervical 06/07/2023   Dizziness    Dyslipidemia    Essential (primary) hypertension 10/15/2020   Establishing care with new doctor, encounter for 07/05/2023   First degree AV block    GERD (gastroesophageal reflux disease)    Headache disorder 03/08/2023   Heart attack (HCC)    x2   History of angina    Impacted cerumen of right ear  07/05/2023   Malaise and fatigue 08/25/2017   Male erectile dysfunction, unspecified 04/05/2019   Manic depression (HCC)    Migraine    Mixed hyperlipidemia 11/16/2018   Neck pain 05/24/2023   Other obesity due to excess calories 10/15/2020   PTSD (post-traumatic stress disorder)    since teenager    Restless legs syndrome 03/16/2017   Smoking 02/21/2015   Syncope and collapse 11/14/2020   Unstable angina (HCC) 08/29/2019   Vertigo    Vitamin D deficiency, unspecified 02/19/2020    Past Surgical History:  Procedure Laterality Date   EYE SURGERY Bilateral    HEMORRHOID SURGERY      Current Medications: Active Medications[1]   Allergies:   Bioflavonoids, Morphine, Nisoldipine, Calcium  oxide, Codeine, Mushroom, Mushroom extract complex (obsolete), Other, and Sulfa antibiotics   Social History   Socioeconomic History   Marital status: Married    Spouse name: Not on file   Number of children: 1   Years of education: Not on file   Highest education level: GED or equivalent  Occupational History   Occupation: disabled  Tobacco Use   Smoking status: Every Day    Current packs/day: 1.50    Average packs/day: 1.5 packs/day for 49.9 years (74.9 ttl pk-yrs)    Types: Cigarettes    Start date: 1976   Smokeless tobacco: Never   Tobacco comments:    used to smoke cigars & pipes  Vaping Use   Vaping status: Never  Used  Substance and Sexual Activity   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   Drug use: Not Currently    Types: Marijuana    Comment: once in a while for neck pain   Sexual activity: Yes  Other Topics Concern   Not on file  Social History Narrative   Lives in an apt with his wife   Right handed   Caffeine: coffee 2 cups daily, root beer occasional    Social Drivers of Health   Tobacco Use: High Risk (06/15/2024)   Patient History    Smoking Tobacco Use: Every Day    Smokeless Tobacco Use: Never    Passive Exposure: Not on file   Financial Resource Strain: Not at Risk (09/15/2023)   Received from [employer]    How hard is it for you to pay for the very basics like food, housing, heating, medical care, and medications?: 1  Food Insecurity: Not at Risk (09/15/2023)   Received from [facility] Insecurity    Within the past 12 months, you worried that your food would run out before you got money to buy more.: 1  Transportation Needs: Not at Risk (09/15/2023)   Received from [facility] Needs    In the past 12 months, has lack of transportation kept you from medical appointments, meetings, work or from getting things needed for daily living?: 1  Physical Activity: At Risk (09/15/2023)   Received [facility]   Physical Activity    On average, how many minutes do you engage in exercise at this level?: 2  Stress: Not at Risk (09/15/2023)   Received from [facility]   Stress    Do you feel these kinds of stress these days?: 1  Social Connections: Not at Risk (09/15/2023)   Received from [facility]   Social Connections    How often do you see or talk to people that you care about and feel close to? (For example: talking to friends on phone, visiting friends or family, going to church or club meetings): 1  Depression (PHQ2-9): Not on file  Alcohol Screen: Not on file  Housing: Low Risk (01/19/2023)   Received from [facility]   Epic    What is your living situation today?: I have a steady place to live    Think about the place you live. Do you have problems with any of the following? Choose all that apply:: None/None on this list  Utilities: Low Risk (01/19/2023)   Received from [facility]   Utilities    In the past 12 months has the electric, gas, oil, or water company threatened to shut off services in your home? : No  Health Literacy: Not on file     Family History: The patient's family history includes Cancer in his maternal grandfather, maternal grandmother, paternal grandfather,  paternal grandmother, and sister; Heart Problems in his father; Hypertension in his father; Migraines in his maternal aunt; Other in his father and maternal aunt; Stroke in his father. ROS:   Please see the history of present illness.    All 14 point review of systems negative except as described per history of present illness  EKGs/Labs/Other Studies Reviewed:         Recent Labs: 07/12/2023: BUN 10; Creatinine, Ser 1.41; Hemoglobin 15.5; Platelets 222; Potassium 4.1; Sodium 143 11/23/2023: ALT 25  Recent Lipid Panel    Component Value Date/Time   CHOL  146 11/23/2023 1334   TRIG 195 (H) 11/23/2023 1334   HDL 43 11/23/2023 1334   CHOLHDL 3.4 11/23/2023 1334   CHOLHDL 3.4 08/29/2019 0809   VLDL 17 08/29/2019 0809   LDLCALC 70 11/23/2023 1334   LDLDIRECT 80 07/12/2023 1403    Physical Exam:    VS:  BP 94/60   Pulse 86   Ht 5' 11 (1.803 m)   Wt 214 lb (97.1 kg)   SpO2 96%   BMI 29.85 kg/m     Wt Readings from Last 3 Encounters:  06/15/24 214 lb (97.1 kg)  02/23/24 213 lb 3.2 oz (96.7 kg)  11/23/23 213 lb 6.4 oz (96.8 kg)     GEN:  Well nourished, well developed in no acute distress HEENT: Normal NECK: No JVD; No carotid bruits LYMPHATICS: No lymphadenopathy CARDIAC: RRR, no murmurs, no rubs, no gallops RESPIRATORY:  Clear to auscultation without rales, wheezing or rhonchi  ABDOMEN: Soft, non-tender, non-distended MUSCULOSKELETAL:  No edema; No deformity  SKIN: Warm and dry LOWER EXTREMITIES: no swelling NEUROLOGIC:  Alert and oriented x 3 PSYCHIATRIC:  Normal affect   ASSESSMENT:    1. Coronary artery disease involving native coronary artery of native heart without angina pectoris   2. Essential (primary) hypertension   3. Chronic obstructive pulmonary disease, unspecified COPD type (HCC)   4. Dyslipidemia    PLAN:    In order of problems listed above:  Chest pain previously multiple cardiac workup showing nonobstructive disease, he however had gallbladder  surgery and after that symptoms subsided completely. Essential hypertension blood pressure well-controlled continue present management. Dyslipidemia he is taking Lipitor 80 which is high intensity statin I did review KPN which show me LDL 73 HDL 43. Smoking we spent at least 10 minutes talking about this I gave him all different himself to quit but honestly I doubt if he will be able to do it but will try to push for it   Medication Adjustments/Labs and Tests Ordered: Current medicines are reviewed at length with the patient today.  Concerns regarding medicines are outlined above.  No orders of the defined types were placed in this encounter.  Medication changes:  Meds ordered this encounter  Medications   nitroGLYCERIN  (NITROSTAT ) 0.4 MG SL tablet    Sig: Place 1 tablet (0.4 mg total) under the tongue every 5 (five) minutes as needed for chest pain.    Dispense:  25 tablet    Refill:  3    Signed, Lamar DOROTHA Fitch, MD, [facility] 06/15/2024 1:54 PM    [facility]    [1]  Current Meds  Medication Sig   albuterol  (VENTOLIN  HFA) 108 (90 Base) MCG/ACT inhaler Inhale 2 puffs into the lungs every 4 (four) hours as needed for wheezing or shortness of breath.   aspirin  EC 81 MG tablet Take 81 mg by mouth 2 (two) times daily. Swallow whole.   atorvastatin  (LIPITOR) 80 MG tablet Take 80 mg by mouth daily.   budesonide  (PULMICORT ) 0.5 MG/2ML nebulizer solution Take 2 mLs (0.5 mg total) by nebulization 2 (two) times daily.   busPIRone  (BUSPAR ) 30 MG tablet Take 50 mg by mouth daily.   ciclopirox  (PENLAC ) 8 % solution Apply topically at bedtime. Apply over nail and surrounding skin. Apply daily over previous coat. After seven (7) days, may remove with alcohol and continue cycle.   clonazePAM  (KLONOPIN ) 1 MG tablet Take 1.5 mg by mouth at bedtime.   dicyclomine (BENTYL) 20 MG tablet Take  20 mg by mouth every 6 (six) hours.   doxepin  (SINEQUAN ) 150 MG capsule Take 150 mg by  mouth at bedtime.   fenofibrate  (TRICOR ) 145 MG tablet Take 1 tablet (145 mg total) by mouth daily.   gabapentin  (NEURONTIN ) 600 MG tablet Take 600 mg by mouth 3 (three) times daily.   lamoTRIgine  (LAMICTAL ) 25 MG tablet Take 50 mg by mouth daily.   meclizine (ANTIVERT) 25 MG tablet TAKE ONE TABLET BY MOUTH EVERY 6 TO 8 HOURS AS NEEDED DIZZINESS; NEEDS APPOINTMENT   meloxicam (MOBIC) 7.5 MG tablet Take 7.5 mg by mouth 2 (two) times daily.   metoprolol  tartrate (LOPRESSOR ) 25 MG tablet TAKE 1/2 TABLET BY MOUTH EVERY DAY and TAKE 1/2 TABLET BY MOUTH DAIILY AT 3 PM   montelukast  (SINGULAIR ) 10 MG tablet Take 10 mg by mouth at bedtime.   nitroGLYCERIN  (NITROSTAT ) 0.4 MG SL tablet Place 1 tablet (0.4 mg total) under the tongue every 5 (five) minutes as needed for chest pain.   pantoprazole  (PROTONIX ) 40 MG tablet Take 40 mg by mouth daily.   PROZAC  20 MG capsule Take 20 mg by mouth daily.   topiramate (TOPAMAX) 50 MG tablet Take 50 mg by mouth at bedtime.   TREMFYA 100 MG/ML pen Inject into the skin every 8 (eight) weeks.   triamcinolone  cream (KENALOG ) 0.1 % Apply 1 Application topically daily as needed (rash).

## 2024-06-15 NOTE — Patient Instructions (Signed)

## 2024-07-18 ENCOUNTER — Other Ambulatory Visit: Payer: Self-pay | Admitting: Cardiology

## 2024-08-02 ENCOUNTER — Other Ambulatory Visit: Payer: Self-pay

## 2024-08-02 MED ORDER — ATORVASTATIN CALCIUM 80 MG PO TABS: 80.0000 mg | ORAL_TABLET | Freq: Every day | ORAL | 2 refills | Status: AC

## 2024-08-08 ENCOUNTER — Ambulatory Visit: Admitting: Allergy

## 2024-08-17 ENCOUNTER — Ambulatory Visit: Admitting: Podiatry

## 2024-08-29 ENCOUNTER — Ambulatory Visit: Admitting: Allergy
